# Patient Record
Sex: Male | Born: 1965 | Race: White | Hispanic: No | Marital: Married | Smoking: Former smoker
Health system: Southern US, Community
[De-identification: ages and names within clinical notes are randomized; demographics above are authoritative.]

## PROBLEM LIST (undated history)

## (undated) DIAGNOSIS — I2510 Atherosclerotic heart disease of native coronary artery without angina pectoris: Secondary | ICD-10-CM

## (undated) DIAGNOSIS — I5189 Other ill-defined heart diseases: Secondary | ICD-10-CM

## (undated) DIAGNOSIS — I10 Essential (primary) hypertension: Secondary | ICD-10-CM

## (undated) DIAGNOSIS — E785 Hyperlipidemia, unspecified: Secondary | ICD-10-CM

## (undated) HISTORY — DX: Essential (primary) hypertension: I10

## (undated) HISTORY — DX: Other ill-defined heart diseases: I51.89

## (undated) HISTORY — DX: Atherosclerotic heart disease of native coronary artery without angina pectoris: I25.10

## (undated) HISTORY — DX: Hyperlipidemia, unspecified: E78.5

---

## 2008-04-24 DIAGNOSIS — I2510 Atherosclerotic heart disease of native coronary artery without angina pectoris: Secondary | ICD-10-CM

## 2008-04-24 HISTORY — DX: Atherosclerotic heart disease of native coronary artery without angina pectoris: I25.10

## 2008-09-19 ENCOUNTER — Inpatient Hospital Stay (HOSPITAL_COMMUNITY): Admission: EM | Admit: 2008-09-19 | Discharge: 2008-09-24 | Payer: Self-pay | Admitting: Emergency Medicine

## 2008-09-19 ENCOUNTER — Emergency Department (HOSPITAL_COMMUNITY): Admission: EM | Admit: 2008-09-19 | Discharge: 2008-09-19 | Payer: Self-pay | Admitting: Family Medicine

## 2008-09-20 ENCOUNTER — Encounter (INDEPENDENT_AMBULATORY_CARE_PROVIDER_SITE_OTHER): Payer: Self-pay | Admitting: Cardiology

## 2008-09-20 HISTORY — PX: NM MYOCAR PERF WALL MOTION: HXRAD629

## 2008-09-20 HISTORY — PX: TRANSTHORACIC ECHOCARDIOGRAM: SHX275

## 2008-09-22 HISTORY — PX: CARDIAC CATHETERIZATION: SHX172

## 2008-11-24 ENCOUNTER — Encounter: Payer: Self-pay | Admitting: Internal Medicine

## 2008-12-24 ENCOUNTER — Encounter: Payer: Self-pay | Admitting: Internal Medicine

## 2009-01-25 ENCOUNTER — Encounter: Payer: Self-pay | Admitting: Internal Medicine

## 2009-03-08 ENCOUNTER — Encounter: Payer: Self-pay | Admitting: Internal Medicine

## 2009-04-05 ENCOUNTER — Encounter: Payer: Self-pay | Admitting: Internal Medicine

## 2009-04-07 ENCOUNTER — Encounter: Payer: Self-pay | Admitting: Internal Medicine

## 2009-04-21 ENCOUNTER — Ambulatory Visit: Payer: Self-pay | Admitting: Internal Medicine

## 2009-04-21 DIAGNOSIS — J310 Chronic rhinitis: Secondary | ICD-10-CM | POA: Insufficient documentation

## 2009-04-21 DIAGNOSIS — R059 Cough, unspecified: Secondary | ICD-10-CM | POA: Insufficient documentation

## 2009-04-21 DIAGNOSIS — I10 Essential (primary) hypertension: Secondary | ICD-10-CM

## 2009-04-21 DIAGNOSIS — R05 Cough: Secondary | ICD-10-CM

## 2009-06-16 ENCOUNTER — Telehealth (INDEPENDENT_AMBULATORY_CARE_PROVIDER_SITE_OTHER): Payer: Self-pay | Admitting: *Deleted

## 2009-06-24 ENCOUNTER — Ambulatory Visit: Payer: Self-pay | Admitting: Internal Medicine

## 2009-06-24 DIAGNOSIS — E785 Hyperlipidemia, unspecified: Secondary | ICD-10-CM | POA: Insufficient documentation

## 2009-07-06 ENCOUNTER — Telehealth (INDEPENDENT_AMBULATORY_CARE_PROVIDER_SITE_OTHER): Payer: Self-pay | Admitting: *Deleted

## 2009-07-07 ENCOUNTER — Ambulatory Visit: Payer: Self-pay | Admitting: Internal Medicine

## 2009-07-07 ENCOUNTER — Ambulatory Visit: Payer: Self-pay | Admitting: Radiology

## 2009-07-07 ENCOUNTER — Ambulatory Visit (HOSPITAL_BASED_OUTPATIENT_CLINIC_OR_DEPARTMENT_OTHER): Admission: RE | Admit: 2009-07-07 | Discharge: 2009-07-07 | Payer: Self-pay | Admitting: Internal Medicine

## 2009-08-10 ENCOUNTER — Ambulatory Visit: Payer: Self-pay | Admitting: Internal Medicine

## 2009-08-24 ENCOUNTER — Ambulatory Visit: Payer: Self-pay | Admitting: Internal Medicine

## 2009-08-27 LAB — CONVERTED CEMR LAB: IgE (Immunoglobulin E), Serum: 55.8 intl units/mL (ref 0.0–180.0)

## 2009-10-27 ENCOUNTER — Encounter: Payer: Self-pay | Admitting: Internal Medicine

## 2009-11-02 ENCOUNTER — Telehealth: Payer: Self-pay | Admitting: Internal Medicine

## 2009-11-09 ENCOUNTER — Ambulatory Visit: Payer: Self-pay | Admitting: Internal Medicine

## 2009-11-23 ENCOUNTER — Ambulatory Visit (HOSPITAL_COMMUNITY): Admission: RE | Admit: 2009-11-23 | Discharge: 2009-11-23 | Payer: Self-pay | Admitting: Internal Medicine

## 2010-05-24 NOTE — Assessment & Plan Note (Signed)
Summary: Pulmonary/ ext ov ? sinusitis   Copy to:  Dr. Merri Brunette Primary Provider/Referring Provider:  Dr. Merri Brunette  CC:  Acute visit.  Pt c/o dry cough x 1 wk now.  He states that after he was seen in Dec 2010 cough went away and then came back a few wks later.  He tried taking tramadol and pepcid and cough resolved then but now is back.  He states that sometimes he feels like he can not take in a deep breath...  History of Present Illness: 78 yowm quit smoking May 2010 at stent with h/o stuffy nose lifelong worse in spring and fall  April 21, 2009 cc new abruptonset cough while on ACE which was dc'd 10/2008 much better but never resolved and then  changed pattern no better with tessilon.  cough is "constant" but   day > night, completely dry assoc with sensation in midline between the shoulder blade,  not reproduced deep breathing.  no worse with ex no worse sleeping.   dx of uacs rx with pred x 6 high dose acid suppression 95% and then tried to control with diet which helped but only for a few weeks then gradually worse.    June 24, 2009  Acute visit.  Pt c/o  dental abscess 2 weeks before ov rx with abx  then much worse  dry cough x 1 wk He tried taking tramadol and pepcid and cough improved then but now is back.  He states that sometimes he feels like he can not take in a deep breath. Pt denies any significant sore throat, dysphagia, itching, sneezing,  nasal congestion or excess secretions,  fever, chills, sweats, unintended wt loss, pleuritic or exertional cp, hempoptysis, change in activity tolerance  orthopnea pnd or leg swelling. Pt also denies any obvious fluctuation in symptoms with weather or environmental change or other alleviating or aggravating factors.       Current Medications (verified): 1)  Lipitor 40 Mg Tabs (Atorvastatin Calcium) .Marland Kitchen.. 1 At Bedtime 2)  Carvedilol 3.125 Mg Tabs (Carvedilol) .Marland Kitchen.. 1 Once Daily 3)  Zetia 10 Mg Tabs (Ezetimibe) .Marland Kitchen.. 1 Once Daily 4)   Plavix 75 Mg Tabs (Clopidogrel Bisulfate) .Marland Kitchen.. 1 Once Daily 5)  Prevacid 24hr 15 Mg Cpdr (Lansoprazole) .... Take One 30-60 Min Before First and Last Meals of The Day 6)  Pepcid Ac Maximum Strength 20 Mg Tabs (Famotidine) .... One At Bedtime 7)  Tramadol Hcl 50 Mg  Tabs (Tramadol Hcl) .... One To Two By Mouth Every 4-6 Hours As Needed  Allergies (verified): No Known Drug Allergies  Past History:  Past Medical History: Hyperlipidemia Chronic cough.....................................Marland KitchenWert  Vital Signs:  Patient profile:   45 year old male Weight:      167 pounds O2 Sat:      99 % on Room air Temp:     98.5 degrees F oral Pulse rate:   110 / minute BP sitting:   140 / 84  (left arm)  Vitals Entered By: Vernie Murders (June 24, 2009 3:31 PM)  O2 Flow:  Room air  Physical Exam  Additional Exam:  amb wm nad   wt 167 June 24, 2009  HEENT: nl dentition,  and orophanx Dentition ok. Nl external ear canals without cough reflex. mod bilateral nonspecific turbinate edema NECK :  without JVD/Nodes/TM/ nl carotid upstrokes bilaterally LUNGS: no acc muscle use, clear to A and P bilaterally without cough on insp or exp maneuvers CV:  RRR  no s3 or murmur or increase in P2, no edema  ABD:  soft and nontender with nl excursion in the supine position. No bruits or organomegaly, bowel sounds nl MS:  warm without deformities, calf tenderness, cyanosis or clubbing      Impression & Recommendations:  Problem # 1:  COUGH (ICD-786.2) Upper airway cough syndrome, so named because it's frequently impossible to sort out how much is LPR vs CR/sinusitis with freq throat clearing generating secondary extra esophageal GERD from wide swings in gastric pressure that occur with throat clearing, promoting self use of mint and menthol lozenges that reduce the lower esophageal sphincter tone and exacerbate the problem further.  These symptoms are easily confused with asthma/copd by even experienced  pulmonogists because they overlap so much. These are the same pts who not infrequently have failed to tolerate ace inhibitors,  dry powder inhalers or biphosphonates or report having reflux symptoms that don't respond to standard doses of PPI  Intriguing exac with upper molar abscess suggesting sinusitis.  therefore rx x 10 days augmentin then sinus ct   Each maintenance medication was reviewed in detail including most importantly the difference between maintenance and as needed and under what circumstances the prns are to be used. See instructions for specific recommendations   Problem # 2:  HYPERTENSION, BENIGN (ICD-401.1)  His updated medication list for this problem includes:    Carvedilol 3.125 Mg Tabs (Carvedilol) .Marland Kitchen... 1 once daily   Consider change to bisoprolol, a more specific beta blocker, if cough continues  Medications Added to Medication List This Visit: 1)  Augmentin 875-125 Mg Tabs (Amoxicillin-pot clavulanate) .... By mouth twice daily 2)  Prednisone 10 Mg Tabs (Prednisone) .... 4 each am x 2days, 2x2days, 1x2days and stop 3)  Tramadol Hcl 50 Mg Tabs (Tramadol hcl) .... One to two by mouth every 4-6 hours as needed  Patient Instructions: 1)  Acid reflux is a  leading suspect here and needs to be eliminated  completely before considering additional studies or treatment options. To suppress this maximally, take Prevacid before first and last meal and pepcid 20 mg (otc) at bedtime plus diet measures as listed as long as coughing at all, and once better to your satisfaction wean the prevacid by taking just the am dose x 6 weeks then return 2)  Take delsym two tsp every 12 hours and add tramadol 50 mg up to every 4 hours to suppress the urge to cough. Swallowing water or using ice chips/non mint and menthol containing candies (such as lifesavers or sugarless jolly ranchers) are also effective.  3)  GERD (REFLUX)  is a common cause of respiratory symptoms. It commonly presents  without heartburn and can be treated with medication, but also with lifestyle changes including avoidance of late meals, excessive alcohol, smoking cessation, and avoid fatty foods, chocolate, peppermint, colas, red wine, and acidic juices such as orange juice. NO MINT OR MENTHOL PRODUCTS SO NO COUGH DROPS  4)  USE SUGARLESS CANDY INSTEAD (jolley ranchers)  5)  NO OIL BASED VITAMINS  6)  Prednisone 10 mg x 6days 7)  Augmentin 875 two times a day x 10 days 8)  If not 100% better return in 2 weeks for cxr and sinus ct Prescriptions: TRAMADOL HCL 50 MG  TABS (TRAMADOL HCL) One to two by mouth every 4-6 hours as needed  #40 x 0   Entered and Authorized by:   Nyoka Cowden MD   Signed by:   Nyoka Cowden  MD on 06/24/2009   Method used:   Electronically to        [id number] (retail)       [address]       [place], [place]  [address]       Ph: [phone] or [phone]       Fax: [phone]   RxID:   [id number] PREDNISONE 10 MG  TABS (PREDNISONE) 4 each am x 2days, 2x2days, 1x2days and stop  #14 x 0   Entered and Authorized by:   Nyoka Cowden MD   Signed by:   Nyoka Cowden MD on 06/24/2009   Method used:   Electronically to        [facility] [phone]* (retail)       [address]       [place], [place]  [address]       Ph: [phone] or [phone]       Fax: [phone]   RxID:   [id number] AUGMENTIN 875-125 MG  TABS (AMOXICILLIN-POT CLAVULANATE) By mouth twice daily  #20 x 0   Entered and Authorized by:   Nyoka Cowden MD   Signed by:   Nyoka Cowden MD on 06/24/2009   Method used:   Electronically to        [facility] [phone][facility] (retail)       [address]       [place], [place]  [address]       Ph: [phone] or [phone]       Fax: [phone]   RxID:   [id number]   Appended Document: Orders Update    Clinical Lists Changes  Orders: Added new Service order of Est. Patient Level IV ([address]3810) - Signed

## 2010-05-24 NOTE — Assessment & Plan Note (Signed)
Summary: [name]   Copy to:  Dr. Merri Larson Primary Provider/Referring Provider:  Dr. Merri Larson  CC:  Acute visit.  Pt c/o cough being no better since last seen.  He states that cough is still  non prod.  No new complaints today.Marland Kitchen  History of Present Illness: 27 yowm quit smoking May 2010 at stent with h/o stuffy nose lifelong worse in spring and fall.  April 21, 2009 cc new abruptonset cough while on ACE which was dc'd 10/2008 much better but never resolved and then  changed pattern no better with tessilon.  cough is "constant" but   day > night, completely dry assoc with vague sensation posteriorly  in midline between the shoulder blades,  not reproduced deep breathing.  no worse with ex no worse sleeping.   dx of uacs rx with pred x 6 high dose acid suppression 95% and then tried to control with diet which helped but only for a few weeks then gradually worse.    June 24, 2009 acute ov c/o  dental abscess 2 weeks before ov rx with abx  then much worse  dry cough x 1 wk He tried taking tramadol and pepcid and cough improved then but now is back.  He states that sometimes he feels like he can not take in a deep breath.  rec empiric augmentin due to ? of dental abscess and intense ppi/pepcid rx plus prednisone x 6 days.  July 07, 2009 Acute visit.  Pt c/o cough being no better since last seen, not even transiently better on prednisone.   He states that cough is still  non prod.  No change in pattern or intensity of cough, did not follow instructions re use of PPi .  Pt denies any significant sore throat, dysphagia, itching, sneezing,  nasal congestion or excess secretions,  fever, chills, sweats, unintended wt loss, pleuritic or exertional cp, hempoptysis, change in activity tolerance  orthopnea pnd or leg swelling. Pt also denies any obvious fluctuation in symptoms with weather or environmental change or other alleviating or aggravating  factors.       Current Medications (verified): 1)  Lipitor 40 Mg Tabs (Atorvastatin Calcium) .Marland Kitchen.. 1 At Bedtime 2)  Carvedilol 3.125 Mg Tabs (Carvedilol) .Marland Kitchen.. 1 Once Daily 3)  Zetia 10 Mg Tabs (Ezetimibe) .Marland Kitchen.. 1 Once Daily 4)  Plavix 75 Mg Tabs (Clopidogrel Bisulfate) .Marland Kitchen.. 1 Once Daily 5)  Prevacid 24hr 15 Mg Cpdr (Lansoprazole) .... Take One 30-60 Min Before First and Last Meals of The Day 6)  Pepcid Ac Maximum Strength 20 Mg Tabs (Famotidine) .... One At Bedtime 7)  Tramadol Hcl 50 Mg  Tabs (Tramadol Hcl) .... One To Two By Mouth Every 4-6 Hours As Needed 8)  Tramadol Hcl 50 Mg  Tabs (Tramadol Hcl) .... One To Two By Mouth Every 4-6 Hours As Needed  Allergies (verified): No Known Drug Allergies  Past History:  Past Medical History: Hyperlipidemia Chronic cough.....................................Marland KitchenWert     - Sinus CT July 07, 2009 chronic changes only   Vital Signs:  Patient profile:   45 year old male Weight:      158 pounds O2 Sat:      98 % on Room air Temp:     98.3 degrees F oral Pulse rate:   91 / minute BP sitting:   120 / 84  (left arm)  Vitals Entered By: Jacob Larson (July 07, 2009 10:59 AM)  O2  Flow:  Room air  Physical Exam  Additional Exam:  amb wm nad   wt 167 June 24, 2009 > 158 July 07, 2009  HEENT: nl dentition,  and orophanx Dentition ok. Nl external ear canals without cough reflex. mod bilateral nonspecific turbinate edema R>> L  NECK :  without JVD/Nodes/TM/ nl carotid upstrokes bilaterally LUNGS: no acc muscle use, clear to A and P bilaterally without cough on insp or exp maneuvers CV:  RRR  no s3 or murmur or increase in P2, no edema  ABD:  soft and nontender with nl excursion in the supine position. No bruits or organomegaly, bowel sounds nl MS:  warm without deformities, calf tenderness, cyanosis or clubbing      CXR  Procedure date:  07/07/2009  Findings:        Comparison: CT angio chest and portable chest x-ray 09/19/2008.     Findings: Mild emphysematous changes in the upper lobes and generalized hyperinflation, unchanged.  Lungs clear. Cardiomediastinal silhouette unremarkable.  Degenerative changes in the mid thoracic spine.   IMPRESSION: COPD/emphysema.  No acute cardiopulmonary disease.  CT of Sinus  Procedure date:  07/07/2009  Findings:      Findings: . The right maxillary sinuses small.  This may represent a so-called atelectatic sinus as result of chronic inflammatory disease.  It could also be developmental or a result of the remote trauma.  Minimal mucosal thickening of the right maxillary sinus. Left maxillary sinus unremarkable.  Opacification of the left posterior ethmoid sinus air cell and mucosal thickening of a right anterior ethmoid sinus air cell.  Sphenoid and frontal sinuses are well-aerated.   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  No definite acute sinusitis.    Impression & Recommendations:  Problem # 1:  COUGH (ICD-786.2) Upper airway cough syndrome, so named because it's frequently impossible to sort out how much is LPR vs CR/sinusitis with freq throat clearing generating secondary extra esophageal GERD from wide swings in gastric pressure that occur with throat clearing, promoting self use of mint and menthol lozenges that reduce the lower esophageal sphincter tone and exacerbate the problem further.  These symptoms are easily confused with asthma/copd by even experienced pulmonogists because they overlap so much. These are the same pts who not infrequently have failed to tolerate ace inhibitors,  dry powder inhalers or biphosphonates or report having reflux symptoms that don't respond to standard doses of PPI  Intriguing exac with upper molar abscess suggested sinusitis.  therefore rx x 10 days augmentin then sinus ct > no acute sinusitis July 07, 2009    The standardized cough guidelines recently  published in Chest are a 14 step process, not a single office visit,  and are intended  to address this problem logically,  with an alogrithm dependent on response to each progressive step  to determine a specific diagnosis with  minimal addtional testing needed. Therefore if compliance is an issue this empiric standardized approach simply won't work.  Will rechallenge with max ppi.  Of the three most common causes of chronic cough, only one (GERD) can actually cause the other two and perpetuate the cylce of cough inducing airway trauma, inflammation, heightened sensitivity to reflux which is prompted by the cough itself via a cyclical mechanism.  This may partially respond to steroids and look like asthma and post nasal drainage but never erradicated completely unless the cough and the secondary reflux are eliminated, preferably both at the  same time. See instructions for specific recommendations   Problem # 2:  HYPERTENSION, BENIGN (ICD-401.1)  The following medications were removed from the medication list:    Carvedilol 3.125 Mg Tabs (Carvedilol) .Marland Kitchen... 1 once daily His updated medication list for this problem includes:    Bystolic 5 Mg Tabs (Nebivolol hcl) ..... One tablet daily  Try the most specific beta blocker on the market in case this is a form of cough variant asthma masquerading as upper airway cough syndrome.  Orders: Est. Patient Level IV ([address]109)  Problem # 3:  CHRONIC RHINITIS (ICD-472.0)  Orders: Est. Patient Level IV (60454)   Sinus Ct reviewed:  Findings: . The right maxillary sinuses small.  This may represent a so-called atelectatic sinus as result of chronic inflammatory disease.  It could also be developmental or a result of the remote trauma.  Minimal mucosal thickening of the right maxillary sinus. Left maxillary sinus unremarkable.  Opacification of the left posterior ethmoid sinus air cell and mucosal thickening of a right anterior ethmoid sinus air cell.   Sphenoid and frontal sinuses are well-aerated.   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  No definite acute sinusitis.  Therefore no rx for now, consider trial of nasal steroids or allergy eval on next ov    Medications Added to Medication List This Visit: 1)  Bystolic 5 Mg Tabs (Nebivolol hcl) .... One tablet daily 2)  Prevacid 30 Mg Cpdr (Lansoprazole) .... Take one 30-60 min before first and last meals of the day  Other Orders: Misc. Referral (Misc. Ref) T-2 View CXR (71020TC)  Patient Instructions: 1)  Acid reflux is a  leading suspect here and needs to be eliminated  completely before considering additional studies or treatment options. To suppress this maximally, take Prevacid 30  before first and last meal and pepcid 20 mg (otc) at bedtime plus diet measures as listed as long as coughing at all, and once better to your  2)  Take delsym two tsp every 12 hours and add tramadol 50 mg up to 2 every 4 hours to suppress the urge to cough. Swallowing water or using ice chips/non mint and menthol containing candies (such as lifesavers or sugarless jolly ranchers) are also effective.  3)  GERD (REFLUX)  is a common cause of respiratory symptoms. It commonly presents without heartburn and can be treated with medication, but also with lifestyle changes including avoidance of late meals, excessive alcohol, smoking cessation, and avoid fatty foods, chocolate, peppermint, colas, red wine, and acidic juices such as orange juice. NO MINT OR MENTHOL PRODUCTS SO NO COUGH DROPS  4)  USE SUGARLESS CANDY INSTEAD (jolley ranchers)  5)  NO OIL BASED VITAMINS  6)  Bystolic 5 mg one daily in place of coreg 7)  See Patient Care Coordinator before leaving for sinus ct  Prescriptions: PREVACID 30 MG CPDR (LANSOPRAZOLE) Take one 30-60 min before first and last meals of the day  #60 x 0   Entered and Authorized by:   Nyoka Cowden MD    Signed by:   Nyoka Cowden MD on 07/07/2009   Method used:   Electronically to        [facility] [phone][facility] (retail)       [address]       [place], [place]  [address]       Ph: [phone] or [phone]       Fax: [phone]  RxID:   [id number]

## 2010-05-24 NOTE — Assessment & Plan Note (Signed)
Summary: Pulmonary/ ext f/u ov for cough > schedule MCT   Copy to:  Dr. Merri Brunette Primary Provider/Referring Provider:  Dr. Merri Brunette  CC:  6 wk followup.  Pt states that his cough is the same- no better or worse.  Marland Kitchen  History of Present Illness: 17 yowm quit smoking May 2010 at stent with h/o stuffy nose lifelong worse in spring and fall.  April 21, 2009 cc new abrupt onset cough while on ACE which was dc'd 10/2008 much better but never resolved and then  changed pattern no better with tessilon.  cough  "constant" but   day > night, completely dry assoc with vague  discomfort  posteriorly  in midline between the shoulder blades,  not reproduced deep breathing.  no worse with ex no worse sleeping.   dx of uacs rx with pred x 6 high dose acid suppression 95% and then tried to control with diet which helped but only for a few weeks then gradually worse.    June 24, 2009 acute ov c/o  dental abscess 2 weeks before ov rx with abx  then much worse  dry cough x 1 wk He tried taking tramadol and pepcid and cough improved then recurred  rec empiric augmentin due to ? of dental abscess and intense ppi/pepcid rx plus prednisone x 6 days.  July 07, 2009 Acute visit.  Pt c/o cough being no better since last seen, not even transiently better on prednisone.    non productive,   No change in pattern or intensity of cough, did not follow instructions re use of PPI. rec max acid, bystolic insead of coreg, check sinus ct > chronic changes only.  August 10, 2009 6 wk followup.  Pt states that cough is still bothering him- no better or worse rec omnaris plus one more cycle of prednisone plus chlortrimeton and continue max gerd rx.  Aug 24, 2009 2 wk followup.  Pt states that his cough had almost completely resolved until about 1 wk ago after finished prednisone cough returned but not nearly bad enough to take it any furher and did not adhere to omnaris. constant sensation of nasal congestion, even on  omnaris/afrin.  rec chlortrimeton no better.  November 09, 2009 6 wk followup.  Pt states that his cough is the same- no better or worse.  has learned to live with nasal congestion, no really taking anything for it anymore including nasal steroids. No cp/ purulent sputum or sob.   Current Medications (verified): 1)  Lipitor 40 Mg Tabs (Atorvastatin Calcium) .Marland Kitchen.. 1 At Bedtime 2)  Zetia 10 Mg Tabs (Ezetimibe) .Marland Kitchen.. 1 Once Daily 3)  Plavix 75 Mg Tabs (Clopidogrel Bisulfate) .Marland Kitchen.. 1 Once Daily 4)  Bystolic 5 Mg  Tabs (Nebivolol Hcl) .... One Tablet Daily 5)  Prevacid 30 Mg Cpdr (Lansoprazole) .Marland Kitchen.. 1 At Bedtime  Allergies (verified): No Known Drug Allergies  Past History:  Past Medical History: Hyperlipidemia Chronic cough.....................................Marland KitchenWert     - Sinus CT July 07, 2009 chronic changes only      - Trial of omnaris August 10, 2009 > no response, but only took it x 5 days with afrin     - Allergy profile Aug 24, 2009 >>> pos only for ragweed allergy     - Methacholine Challenge ordered November 09, 2009 >>>  Vital Signs:  Patient profile:   45 year old male Weight:      164.50 pounds O2 Sat:  98 % on Room air Temp:     98.1 degrees F oral Pulse rate:   81 / minute BP sitting:   112 / 70  (left arm)  Vitals Entered By: Vernie Murders (November 09, 2009 4:07 PM)  O2 Flow:  Room air  Physical Exam  Additional Exam:  amb wm nad obvious sniffling and nasal tone to voice  wt 167 June 24, 2009 > 158 July 07, 2009 > 158 August 10, 2009 > 160 Aug 24, 2009 > 164 .dte  HEENT: nl dentition,  and orophanx Dentition ok. Nl external ear canals without cough reflex. severe  bilateral nonspecific turbinate edema  bilateral  NECK :  without JVD/Nodes/TM/ nl carotid upstrokes bilaterally LUNGS: no acc muscle use, clear to A and P bilaterally without cough on insp or exp maneuvers CV:  RRR  no s3 or murmur or increase in P2, no edema  ABD:  soft and nontender with nl excursion in the  supine position. No bruits or organomegaly, bowel sounds nl MS:  warm without deformities, calf tenderness, cyanosis or clubbing      Impression & Recommendations:  Problem # 1:  COUGH (ICD-786.2) The most common causes of chronic cough in immunocompetent adults include: upper airway cough syndrome (UACS), previously referred to as postnasal drip syndrome,  caused by variety of rhinosinus conditions; (2) asthma; (3) GERD; (4) chronic bronchitis from cigarette smoking or other inhaled environmental irritants; (5) nonasthmatic eosinophilic bronchitis; and (6) bronchiectasis. These conditions, singly or in combination, have accounted for up to 94% of the causes of chronic cough in prospective studies.  The standardized cough guidelines recently published in Chest are a 14 step process, not a single office visit,  and are intended  to address this problem logically,  with an alogrithm dependent on response to each progressive step  to determine a specific diagnosis with  minimal addtional testing needed. Therefore if compliance is an issue this empiric standardized approach simply won't work.   NB the  ramp to expected improvement (and for that matter, worsening, if a chronic effective medication is stopped)  can be measured in weeks, not days, a common misconception because this is not Heartburn with no immediate cause and effect relationship so that response to therapy or lack thereof can be very difficult to assess.  for now try stop second dose of ppi and see if cough flares.  If not controlled on max gerd rx next step is methacholine challenge test > ordered to proceed in 2 weeks after max gerd rx with ppi/h2 hs in meantime  Problem # 2:  CHRONIC RHINITIS (ICD-472.0)  Allergic to ragweed but symptoms are perennial and he did improve on prednisone, nasal steroids but did not continue these once better.  Ext discussion re : I emphasized that nasal steroids have no immediate benefit in terms of  improving symptoms.  To help them reached the target tissue, the patient should use Afrin two puffs every 12 hours applied one min before using the nasal steroids.  Afrin should be stopped after no more than 5 days.  If the symptoms worsen, Afrin can be restarted after 5 days off of therapy to prevent rebound congestion from overuse of Afrin.  I also emphasized that in no way are nasal steroids a concern in terms of "addiction".    Orders: Est. Patient Level IV ([address])  Problem # 3:  HYPERTENSION, BENIGN (ICD-401.1)  His updated medication list for this problem includes:  Bystolic 5 Mg Tabs (Nebivolol hcl) ..... One tablet daily  Best option: Bystolic, the most beta -1  selective Beta blocker available in sample form, with bisoprolol the most selective generic choice  on the market. esp until we complete the Methacholine challenge test to avoid issue of false positive.  Medications Added to Medication List This Visit: 1)  Prevacid 30 Mg Cpdr (Lansoprazole) .Marland Kitchen.. 1 at bedtime 2)  Prevacid 30 Mg Cpdr (Lansoprazole) .... Take  one 30-60 min before first meal of the day 3)  Pepcid Ac Maximum Strength 20 Mg Tabs (Famotidine) .... One at bedtime  Other Orders: Misc. Referral (Misc. Ref)  Patient Instructions: 1)  See Patient Care Coordinator before leaving for methacholine challenge test which you shouldn't do until you've been on acid suppression consitently for 2 weeks

## 2010-05-24 NOTE — Letter (Signed)
Summary: [facility]   Imported By: Lester Spiro 11/09/2009 08:26:56  _____________________________________________________________________  External Attachment:    Type:   Image     Comment:   External Document

## 2010-05-24 NOTE — Progress Notes (Signed)
Summary: Refill Tramadol  Phone Note Call from Patient Call back at [phone]   Caller: Patient Call For: [name] Summary of Call: NEED PRESCRIPT FOR TRAMADOL CALLED TO [facility] PH# [phone] Initial call taken by: Rickard Patience,  June 16, 2009 2:07 PM  Follow-up for Phone Call        Spoke with pt.  He c/io dry cough and "tickle in throat" x 2 days.  He states that his cough did improve after he was seeen in Dec 2010 but then came back shortly and he states that tramadol is the only thing that helped.  He has ov with you on 06/24/09 but would lile refill on tramadol in the meantime.  Please advise if this is okay, thanks!  Follow-up by: Vernie Murders,  June 16, 2009 2:33 PM  Additional Follow-up for Phone Call Additional follow up Details #1::        ok to refill but keep f/u appt with all meds in hand  Additional Follow-up by: Nyoka Cowden MD,  June 16, 2009 4:33 PM    Additional Follow-up for Phone Call Additional follow up Details #2::    Rx called to the pharmacy with no refills.  LMOVM for pt to be made aware. Follow-up by: Vernie Murders,  June 16, 2009 4:51 PM  Prescriptions: TRAMADOL HCL 50 MG  TABS (TRAMADOL HCL) One to two by mouth every 4-6 hours as needed  #40 x 0   Entered by:   Vernie Murders   Authorized by:   Nyoka Cowden MD   Signed by:   Vernie Murders on 06/16/2009   Method used:   Historical   RxID:   [id number]

## 2010-05-24 NOTE — Assessment & Plan Note (Signed)
Summary: Pulmonary/ f/u ov with cough 90% better   Copy to:  Dr. Merri Brunette Primary Provider/Referring Provider:  Dr. Merri Brunette  CC:  2 wk followup.  Pt states that his cough had almost completely resolved until about 1 wk ago after finished prednisone cough returned.Jacob Larson  History of Present Illness: 43 yowm quit smoking May 2010 at stent with h/o stuffy nose lifelong worse in spring and fall.  April 21, 2009 cc new abruptonset cough while on ACE which was dc'd 10/2008 much better but never resolved and then  changed pattern no better with tessilon.  cough  "constant" but   day > night, completely dry assoc with vague  discomfort  posteriorly  in midline between the shoulder blades,  not reproduced deep breathing.  no worse with ex no worse sleeping.   dx of uacs rx with pred x 6 high dose acid suppression 95% and then tried to control with diet which helped but only for a few weeks then gradually worse.    June 24, 2009 acute ov c/o  dental abscess 2 weeks before ov rx with abx  then much worse  dry cough x 1 wk He tried taking tramadol and pepcid and cough improved then recurred  rec empiric augmentin due to ? of dental abscess and intense ppi/pepcid rx plus prednisone x 6 days.  July 07, 2009 Acute visit.  Pt c/o cough being no better since last seen, not even transiently better on prednisone.    non productive,   No change in pattern or intensity of cough, did not follow instructions re use of PPI. rec max acid, bystolic insead of coreg, check sinus ct > chronic changes only.  August 10, 2009 6 wk followup.  Pt states that cough is still bothering him- no better or worse rec omnaris plus one more cycle of prednisone plus chlortrimeton and continue max gerd rx.  Aug 24, 2009 2 wk followup.  Pt states that his cough had almost completely resolved until about 1 wk ago after finished prednisone cough returned but not nearly bad enough to take it any furher and did not adhere to omnaris.  constant sensation of nasal congestion, even on omnaris/afrin. Pt denies any significant sore throat, dysphagia,   fever, chills, sweats, unintended wt loss, pleuritic or exertional cp, hempoptysis, change in activity tolerance  orthopnea pnd or leg swelling.  Current Medications (verified): 1)  Lipitor 40 Mg Tabs (Atorvastatin Calcium) .Jacob Larson.. 1 At Bedtime 2)  Zetia 10 Mg Tabs (Ezetimibe) .Jacob Larson.. 1 Once Daily 3)  Plavix 75 Mg Tabs (Clopidogrel Bisulfate) .Jacob Larson.. 1 Once Daily 4)  Pepcid Ac Maximum Strength 20 Mg Tabs (Famotidine) .... One At Bedtime 5)  Bystolic 5 Mg  Tabs (Nebivolol Hcl) .... One Tablet Daily 6)  Prevacid 30 Mg Cpdr (Lansoprazole) .... Take One 30-60 Min Before First and Last Meals of The Day  Allergies (verified): No Known Drug Allergies  Past History:  Past Medical History: Hyperlipidemia Chronic cough.....................................Jacob KitchenWert     - Sinus CT July 07, 2009 chronic changes only      - Trial of omnaris August 10, 2009 > no response, but only took it x 5 days with afrin     - Allergy profile Aug 24, 2009 >>>  Vital Signs:  Patient profile:   45 year old male Weight:      160 pounds O2 Sat:      98 % on Room air Temp:     97.9  degrees F oral Pulse rate:   76 / minute BP sitting:   118 / 80  (left arm)  Vitals Entered By: Vernie Murders (Aug 24, 2009 12:02 PM)  O2 Flow:  Room air  Physical Exam  Additional Exam:  amb wm nad obvious sniffling and nasal tone to voice  wt 167 June 24, 2009 > 158 July 07, 2009 > 158 August 10, 2009 > 160 Aug 24, 2009  HEENT: nl dentition,  and orophanx Dentition ok. Nl external ear canals without cough reflex. mod bilateral nonspecific turbinate edema R>> L  NECK :  without JVD/Nodes/TM/ nl carotid upstrokes bilaterally LUNGS: no acc muscle use, clear to A and P bilaterally without cough on insp or exp maneuvers CV:  RRR  no s3 or murmur or increase in P2, no edema  ABD:  soft and nontender with nl excursion in the supine  position. No bruits or organomegaly, bowel sounds nl MS:  warm without deformities, calf tenderness, cyanosis or clubbing      Impression & Recommendations:  Problem # 1:  CHRONIC RHINITIS (ICD-472.0)  Orders: T-Allergy Profile [place], [place], [place], [place], [place] ([id number]) Est. Patient Level IV ([address])  Noncompliant with omnaris, has not tried singulair yet but not inclined to rx more aggressively at present  Problem # 2:  COUGH (ICD-786.2)  The most common causes of chronic cough in immunocompetent adults include: upper airway cough syndrome (UACS), previously referred to as postnasal drip syndrome,  caused by variety of rhinosinus conditions; (2) asthma; (3) GERD; (4) chronic bronchitis from cigarette smoking or other inhaled environmental irritants; (5) nonasthmatic eosinophilic bronchitis; and (6) bronchiectasis. These conditions, singly or in combination, have accounted for up to 94% of the causes of chronic cough in prospective studies.  The standardized cough guidelines recently published in Chest are a 14 step process, not a single office visit,  and are intended  to address this problem logically,  with an alogrithm dependent on response to each progressive step  to determine a specific diagnosis with  minimal addtional testing needed. Therefore if compliance is an issue this empiric standardized approach simply won't work.   NB the  ramp to expected improvement (and for that matter, worsening, if a chronic effective medication is stopped)  can be measured in weeks, not days, a common misconception because this is not Heartburn with no immediate cause and effect relationship so that response to therapy or lack thereof can be very difficult to assess.  for now try stop second dose of ppi and see if cough flares.  If not controlled on max gerd rx next step is methacholine challenge test  Orders: Est. Patient Level IV ([address])  Problem # 3:  HYPERTENSION, BENIGN (ICD-401.1)  His updated  medication list for this problem includes:    Bystolic 5 Mg Tabs (Nebivolol hcl) ..... One tablet daily   Prefer avoid non-selective B1 for now in favor of Bystolic, the most beta -1  selective Beta blocker available in sample form, with bisoprolol the most selective generic choice  on the market.   Medications Added to Medication List This Visit: 1)  Prevacid 30 Mg Cpdr (Lansoprazole) .... Take  one 30-60 min before first meal of the day  Patient Instructions: 1)  Please schedule a follow-up appointment in 6 weeks, sooner if needed  2)  Try off the pm dose of prevacid if tolerated 3)  if drainage, itching/sneezing try chlortrimeton  4)  Call if condition worsens 5)  Next step is methacholine challenge  Prescriptions: BYSTOLIC 5 MG  TABS (NEBIVOLOL HCL) One tablet daily  #30 x 11   Entered and Authorized by:   Nyoka Cowden MD   Signed by:   Nyoka Cowden MD on 08/24/2009   Method used:   Electronically to        [facility]* (retail)       [address] [place], [place]  [address]       Ph: [phone]       Fax: [phone]   RxID:   [id number]

## 2010-05-24 NOTE — Letter (Signed)
Summary: [facility]   Imported By: Lester [place] 11/09/2009 08:36:10  _____________________________________________________________________  External Attachment:    Type:   Image     Comment:   External Document

## 2010-05-24 NOTE — Assessment & Plan Note (Signed)
Summary: Pulmonary/ ext f/u cough/ rx omnaris   Copy to:  Dr. Merri Brunette Primary Provider/Referring Provider:  Dr. Merri Brunette  CC:  6 wk followup.  Pt states that cough is still bothering him- no better or worse.  Cough is non prod.  He states that he sometimes forgets to take his pm dose of the prevacid.  He has had to d/c the tramadol because this makes him "feel spaced out"..  History of Present Illness: 5 yowm quit smoking May 2010 at stent with h/o stuffy nose lifelong worse in spring and fall.  April 21, 2009 cc new abruptonset cough while on ACE which was dc'd 10/2008 much better but never resolved and then  changed pattern no better with tessilon.  cough is "constant" but   day > night, completely dry assoc with vague sensation posteriorly  in midline between the shoulder blades,  not reproduced deep breathing.  no worse with ex no worse sleeping.   dx of uacs rx with pred x 6 high dose acid suppression 95% and then tried to control with diet which helped but only for a few weeks then gradually worse.    June 24, 2009 acute ov c/o  dental abscess 2 weeks before ov rx with abx  then much worse  dry cough x 1 wk He tried taking tramadol and pepcid and cough improved then but now is back.  He states that sometimes he feels like he can not take in a deep breath.  rec empiric augmentin due to ? of dental abscess and intense ppi/pepcid rx plus prednisone x 6 days.  July 07, 2009 Acute visit.  Pt c/o cough being no better since last seen, not even transiently better on prednisone.   He states that cough is still  non prod.  No change in pattern or intensity of cough, did not follow instructions re use of PPI. rec max acid, bystolic insead of coreg, check sinus ct > chronic changes only.  August 10, 2009 6 wk followup.  Pt states that cough is still bothering him- no better or worse.  Cough is non prod.  He states that he sometimes forgets to take his pm dose of the prevacid.  He has had to  d/c the tramadol because this makes him "feel spaced out".  Notes increase nasal congestion but no definite tkckle like he got while on ACE.  Pt denies any significant sore throat, dysphagia, itching, sneezing,   fever, chills, sweats, unintended wt loss, pleuritic or exertional cp, hempoptysis, change in activity .tolerance  orthopnea pnd or leg swelling, denies  obvious fluctuation in symptoms with weather/ environmental change or other alleviating aggravating factors  Current Medications (verified): 1)  Lipitor 40 Mg Tabs (Atorvastatin Calcium) .Marland Kitchen.. 1 At Bedtime 2)  Zetia 10 Mg Tabs (Ezetimibe) .Marland Kitchen.. 1 Once Daily 3)  Plavix 75 Mg Tabs (Clopidogrel Bisulfate) .Marland Kitchen.. 1 Once Daily 4)  Pepcid Ac Maximum Strength 20 Mg Tabs (Famotidine) .... One At Bedtime 5)  Bystolic 5 Mg  Tabs (Nebivolol Hcl) .... One Tablet Daily 6)  Prevacid 30 Mg Cpdr (Lansoprazole) .... Take One 30-60 Min Before First and Last Meals of The Day  Allergies (verified): No Known Drug Allergies  Past History:  Past Medical History: Hyperlipidemia Chronic cough.....................................Marland KitchenWert     - Sinus CT July 07, 2009 chronic changes only      - Trial of omnaris August 10, 2009   Vital Signs:  Patient profile:   45 year old  male Weight:      158 pounds O2 Sat:      97 % on Room air Temp:     98.8 degrees F oral Pulse rate:   93 / minute BP sitting:   122 / 80  (left arm)  Vitals Entered By: Vernie Murders (August 10, 2009 11:44 AM)  O2 Flow:  Room air  Physical Exam  Additional Exam:  amb wm nad obvious sniffling and nasal tone to voice  wt 167 June 24, 2009 > 158 July 07, 2009 > 158 August 10, 2009  HEENT: nl dentition,  and orophanx Dentition ok. Nl external ear canals without cough reflex. mod bilateral nonspecific turbinate edema R>> L  NECK :  without JVD/Nodes/TM/ nl carotid upstrokes bilaterally LUNGS: no acc muscle use, clear to A and P bilaterally without cough on insp or exp maneuvers CV:   RRR  no s3 or murmur or increase in P2, no edema  ABD:  soft and nontender with nl excursion in the supine position. No bruits or organomegaly, bowel sounds nl MS:  warm without deformities, calf tenderness, cyanosis or clubbing      Impression & Recommendations:  Problem # 1:  CHRONIC RHINITIS (ICD-472.0)  Severe on exam and probably a factor in his chronic cough.   Each maintenance medication was reviewed in detail including most importantly the difference between maintenance and as needed and under what circumstances the prns are to be used. See instructions for specific recommendations   Orders: Est. Patient Level IV (40981)  Problem # 2:  COUGH (ICD-786.2)  The most common causes of chronic cough in immunocompetent adults include: upper airway cough syndrome (UACS), previously referred to as postnasal drip syndrome,  caused by variety of rhinosinus conditions; (2) asthma; (3) GERD; (4) chronic bronchitis from cigarette smoking or other inhaled environmental irritants; (5) nonasthmatic eosinophilic bronchitis; and (6) bronchiectasis. These conditions, singly or in combination, have accounted for up to 94% of the causes of chronic cough in prospective studies.   still feel this is most c/w  Classic Upper airway cough syndrome, so named because it's frequently impossible to sort out how much is  CR/sinusitis with freq throat clearing (which can be related to primary GERD)   vs  causing  secondary extra esophageal GERD from wide swings in gastric pressure that occur with throat clearing, promoting self use of mint and menthol lozenges that reduce the lower esophageal sphincter tone and exacerbate the problem further These are the same pts who not infrequently have failed to tolerate ace inhibitors,  dry powder inhalers or biphosphonates or report having reflux symptoms that don't respond to standard doses of PPI   Orders: Est. Patient Level IV (19147)  Problem # 3:  HYPERTENSION, BENIGN  (ICD-401.1)  ok on rx   His updated medication list for this problem includes:    Bystolic 5 Mg Tabs (Nebivolol hcl) ..... One tablet daily  avoid  nonspecific Beta blockers until we sort this out.  Orders: Est. Patient Level IV (82956)  Medications Added to Medication List This Visit: 1)  Prednisone 10 Mg Tabs (Prednisone) .... 4 each am x 2 days,  3 x 2days, 2x2 days, and 1x2 days 2)  Omnaris 50 Mcg/act Susp (Ciclesonide) .... One twice daily  Complete Medication List: 1)  Lipitor 40 Mg Tabs (Atorvastatin calcium) .Marland Kitchen.. 1 at bedtime 2)  Zetia 10 Mg Tabs (Ezetimibe) .Marland Kitchen.. 1 once daily 3)  Plavix 75 Mg Tabs (Clopidogrel bisulfate) .Marland Kitchen.. 1 once  daily 4)  Pepcid Ac Maximum Strength 20 Mg Tabs (Famotidine) .... One at bedtime 5)  Bystolic 5 Mg Tabs (Nebivolol hcl) .... One tablet daily 6)  Prevacid 30 Mg Cpdr (Lansoprazole) .... Take one 30-60 min before first and last meals of the day 7)  Prednisone 10 Mg Tabs (Prednisone) .... 4 each am x 2 days,  3 x 2days, 2x2 days, and 1x2 days 8)  Omnaris 50 Mcg/act Susp (Ciclesonide) .... One twice daily  Patient Instructions: 1)  Acid reflux is a  leading suspect here and needs to be eliminated  completely before considering additional studies or treatment options. To suppress this maximally, take Prevacid 30  before first and last meal and pepcid 20 mg (otc) at bedtime plus diet measures as listed as long as coughing at all, and once better to your  2)  Take delsym two tsp every 12 hours and add tramadol 50 mg up to 2 every 4 hours to suppress the urge to cough. Swallowing water or using ice chips/non mint and menthol containing candies (such as lifesavers or sugarless jolly ranchers) are also effective.  3)  GERD (REFLUX)  is a common cause of respiratory symptoms. It commonly presents without heartburn and can be treated with medication, but also with lifestyle changes including avoidance of late meals, excessive alcohol, smoking cessation, and  avoid fatty foods, chocolate, peppermint, colas, red wine, and acidic juices such as orange juice. NO MINT OR MENTHOL PRODUCTS SO NO COUGH DROPS  4)  USE SUGARLESS CANDY INSTEAD (jolley ranchers)  5)  NO OIL BASED VITAMINS  6)  Bystolic 5 mg one daily in place of coreg 7)  Chlortrimeton 4 mg one every 6 hours as needed for itching sneezing  8)  Stay on prevacid Take one 30-60 min before first and last meals of the day and pepcid at bedtime 9)  Omnaris one twice daily 10)  I emphasized that nasal steroids have no immediate benefit in terms of improving symptoms.  To help them reached the target tissue, the patient should use Afrin two puffs every 12 hours applied one min before using the nasal steroids.  Afrin should be stopped after no more than 5 days.  If the symptoms worsen, Afrin can be restarted after 5 days off of therapy to prevent rebound congestion from overuse of Afrin.  I also emphasized that in no way are nasal steroids a concern in terms of "addiction". 11)   Please schedule a follow-up appointment in 2 weeks, sooner if needed  Prescriptions: PREDNISONE 10 MG  TABS (PREDNISONE) 4 each am x 2 days,  3 x 2days, 2x2 days, and 1x2 days  #20 x 0   Entered and Authorized by:   Nyoka Cowden MD   Signed by:   Nyoka Cowden MD on 08/10/2009   Method used:   Electronically to        [facility] [phone]* (retail)       [address]       [place], [place]  [address]       Ph: [phone] or [phone]       Fax: [phone]   RxID:   [id number]

## 2010-05-24 NOTE — Progress Notes (Signed)
Summary: nos appt  Phone Note Call from Patient   Caller: [email or web address]  Call For: Jacob Larson Summary of Call: Rsc nos from 7/11 to 7/19 @ 4p. Initial call taken by: Darletta Moll,  November 02, 2009 9:37 AM

## 2010-05-24 NOTE — Progress Notes (Signed)
Summary: ? sinus ct or ov  Phone Note Call from Patient Call back at Work Phone [phone]   Caller: Patient Call For: wert Reason for Call: Talk to Nurse Summary of Call: returned call leslie Initial call taken by: Eugene Gavia,  July 06, 2009 8:34 AM  Follow-up for Phone Call        Dr. Sherene Sires, pt had requested tramadol and you advised that okay x 1 but needs to sched sinus ct.  Before I could get in touch with him, we went ahead and sched appt with you for tommorrow.  Do you want pt to keep appt or just go ahead and sched sinus ct for now? Please advise thanks! ok for ov Follow-up by: Vernie Murders,  July 06, 2009 8:55 AM  Additional Follow-up for Phone Call Additional follow up Details #1::        Tramadol has been refilled.  LMOM for pt to be aware of this and that he should keep appt for tommorrow.   Additional Follow-up by: Vernie Murders,  July 06, 2009 2:41 PM

## 2010-08-01 LAB — BASIC METABOLIC PANEL
BUN: 15 mg/dL (ref 6–23)
CO2: 26 mEq/L (ref 19–32)
CO2: 29 mEq/L (ref 19–32)
CO2: 31 mEq/L (ref 19–32)
Calcium: 9.1 mg/dL (ref 8.4–10.5)
Calcium: 9.5 mg/dL (ref 8.4–10.5)
Chloride: 106 mEq/L (ref 96–112)
Chloride: 107 mEq/L (ref 96–112)
Chloride: 107 mEq/L (ref 96–112)
Creatinine, Ser: 0.86 mg/dL (ref 0.4–1.5)
GFR calc Af Amer: >60 mL/min (ref >60)
GFR calc Af Amer: >60 mL/min (ref >60)
Glucose, Bld: 121 mg/dL — ABNORMAL HIGH (ref 70–99)
Glucose, Bld: 123 mg/dL — ABNORMAL HIGH (ref 70–99)
Glucose, Bld: 123 mg/dL — ABNORMAL HIGH (ref 70–99)
Potassium: 3.4 mEq/L — ABNORMAL LOW (ref 3.5–5.1)
Potassium: 3.7 mEq/L (ref 3.5–5.1)
Sodium: 143 mEq/L (ref 135–145)
Sodium: 145 mEq/L (ref 135–145)

## 2010-08-01 LAB — CBC
HCT: 45.5 % (ref 39.0–52.0)
Hemoglobin: 14.9 g/dL (ref 13.0–17.0)
Hemoglobin: 16 g/dL (ref 13.0–17.0)
MCHC: 35.1 g/dL (ref 30.0–36.0)
MCHC: 35.2 g/dL (ref 30.0–36.0)
MCV: 93.4 fL (ref 78.0–100.0)
MCV: 94.1 fL (ref 78.0–100.0)
Platelets: 198 10*3/uL (ref 150–400)
RBC: 4.52 MIL/uL (ref 4.22–5.81)
RBC: 4.53 MIL/uL (ref 4.22–5.81)
RDW: 13.3 % (ref 11.5–15.5)
RDW: 13.4 % (ref 11.5–15.5)
WBC: 13.1 10*3/uL — ABNORMAL HIGH (ref 4.0–10.5)

## 2010-08-01 LAB — CARDIAC PANEL(CRET KIN+CKTOT+MB+TROPI)
CK, MB: 3.3 ng/mL (ref 0.3–4.0)
CK, MB: 5.6 ng/mL — ABNORMAL HIGH (ref 0.3–4.0)
Relative Index: INVALID (ref 0.0–2.5)
Relative Index: INVALID (ref 0.0–2.5)
Relative Index: INVALID (ref 0.0–2.5)
Total CK: 72 U/L (ref 7–232)
Total CK: 87 U/L (ref 7–232)
Troponin I: 0.4 ng/mL — ABNORMAL HIGH (ref 0.00–0.06)
Troponin I: 0.43 ng/mL — ABNORMAL HIGH (ref 0.00–0.06)

## 2010-08-02 LAB — CK TOTAL AND CKMB (NOT AT ARMC)
CK, MB: 1.6 ng/mL (ref 0.3–4.0)
Relative Index: INVALID (ref 0.0–2.5)

## 2010-08-02 LAB — COMPREHENSIVE METABOLIC PANEL
AST: 22 U/L (ref 0–37)
Albumin: 4 g/dL (ref 3.5–5.2)
BUN: 7 mg/dL (ref 6–23)
Creatinine, Ser: 0.69 mg/dL (ref 0.4–1.5)
GFR calc Af Amer: >60 mL/min (ref >60)
Total Protein: 7.1 g/dL (ref 6.0–8.3)

## 2010-08-02 LAB — DIFFERENTIAL
Basophils Absolute: 0 10*3/uL (ref 0.0–0.1)
Eosinophils Relative: 0 % (ref 0–5)
Lymphocytes Relative: 10 % — ABNORMAL LOW (ref 12–46)
Lymphs Abs: 1.4 10*3/uL (ref 0.7–4.0)
Lymphs Abs: 2.8 10*3/uL (ref 0.7–4.0)
Monocytes Absolute: 0.7 10*3/uL (ref 0.1–1.0)
Monocytes Relative: 5 % (ref 3–12)
Monocytes Relative: 9 % (ref 3–12)
Neutro Abs: 11.6 10*3/uL — ABNORMAL HIGH (ref 1.7–7.7)
Neutro Abs: 6.7 10*3/uL (ref 1.7–7.7)
Neutrophils Relative %: 63 % (ref 43–77)

## 2010-08-02 LAB — POCT I-STAT, CHEM 8
BUN: 8 mg/dL (ref 6–23)
Calcium, Ion: 1.14 mmol/L (ref 1.12–1.32)
Chloride: 107 mEq/L (ref 96–112)
Creatinine, Ser: 0.7 mg/dL (ref 0.4–1.5)
Glucose, Bld: 100 mg/dL — ABNORMAL HIGH (ref 70–99)
HCT: 47 % (ref 39.0–52.0)
Hemoglobin: 16 g/dL (ref 13.0–17.0)
Potassium: 4.1 mEq/L (ref 3.5–5.1)
Sodium: 138 meq/L (ref 135–145)
TCO2: 23 mmol/L (ref 0–100)

## 2010-08-02 LAB — URINALYSIS, ROUTINE W REFLEX MICROSCOPIC
Glucose, UA: NEGATIVE mg/dL
Hgb urine dipstick: NEGATIVE
Ketones, ur: 15 mg/dL — AB
Protein, ur: NEGATIVE mg/dL
Urobilinogen, UA: 0.2 mg/dL (ref 0.0–1.0)

## 2010-08-02 LAB — CBC
HCT: 44.2 % (ref 39.0–52.0)
MCV: 92.8 fL (ref 78.0–100.0)
MCV: 93 fL (ref 78.0–100.0)
Platelets: 208 10*3/uL (ref 150–400)
RBC: 4.82 MIL/uL (ref 4.22–5.81)
RDW: 13.4 % (ref 11.5–15.5)
WBC: 10.6 10*3/uL — ABNORMAL HIGH (ref 4.0–10.5)
WBC: 13.9 10*3/uL — ABNORMAL HIGH (ref 4.0–10.5)

## 2010-08-02 LAB — CARDIAC PANEL(CRET KIN+CKTOT+MB+TROPI)
Relative Index: 1.5 (ref 0.0–2.5)
Relative Index: INVALID (ref 0.0–2.5)
Relative Index: INVALID (ref 0.0–2.5)
Total CK: 110 U/L (ref 7–232)
Total CK: 90 U/L (ref 7–232)
Troponin I: 0.07 ng/mL — ABNORMAL HIGH (ref 0.00–0.06)
Troponin I: 0.11 ng/mL — ABNORMAL HIGH (ref 0.00–0.06)

## 2010-08-02 LAB — PROTIME-INR: INR: 0.9 (ref 0.00–1.49)

## 2010-08-02 LAB — APTT: aPTT: 31 seconds (ref 24–37)

## 2010-08-02 LAB — POCT CARDIAC MARKERS
CKMB, poc: <1 ng/mL — ABNORMAL LOW (ref 1.0–8.0)
Myoglobin, poc: 74.8 ng/mL (ref 12–200)
Troponin i, poc: <0.05 ng/mL (ref 0.00–0.09)

## 2010-08-02 LAB — RAPID URINE DRUG SCREEN, HOSP PERFORMED
Cocaine: NOT DETECTED
Tetrahydrocannabinol: NOT DETECTED

## 2010-08-02 LAB — LIPID PANEL
HDL: 48 mg/dL (ref >39)
Total CHOL/HDL Ratio: 5.8 RATIO

## 2010-09-06 NOTE — Cardiovascular Report (Signed)
NAME:  [name], [name] NO.:  [id number]   MEDICAL RECORD NO.:  [id number]          PATIENT TYPE:  INP   LOCATION:  2918                         FACILITY:  [facility]   PHYSICIAN:  Cristy Hilts. Jacinto Halim, MD       DATE OF BIRTH:  17-Dec-1965   DATE OF PROCEDURE:  09/22/2008  DATE OF DISCHARGE:                            CARDIAC CATHETERIZATION   PROCEDURES PERFORMED:  1. Percutaneous transluminal coronary angioplasty and stenting of the      distal RCA and mid RCA with implantation of 2 overlapping stents.  2. Left ventriculography.   INDICATIONS:  Jacob Larson is a 45 year old gentleman who was admitted to  hospital with non-ST-elevation myocardial infarction.  He has history of  smoking, hyperlipidemia, and hypertension as risk factors.  He underwent  diagnostic cardiac catheterization by Dr. Sheliah Mends.  He was found  to have high-grade stenosis of the distal right coronary artery.  The  mid right coronary artery stenosis appeared to be moderate at most;  however because of continued chest discomfort, we felt that this was  probably significant and also appeared to be 80%, hence we decided to  stent this although it was very complex anatomy.   Please see Dr. Gretta Began dictation for complete diagnostic cardiac  catheterization data.   Briefly right coronary artery.  Right coronary artery is a large-caliber  vessel and a dominant vessel giving origin to a high PDA and continues  as a distal RCA and gives origin to secondary PDA and a moderate-sized  PLA branch.  At the bifurcation of the primary PDA and distal right  coronary artery, there is a complex 80% stenosis.  Mid segment of the  distal right coronary artery showed a focal 95-99% hazy stenosis.   INTERVENTION DATA:  Successful PTCA and stenting of the distal right  coronary artery with implantation of a 2.5 x 24-mm endeavor drug-eluting  stent.  Poststent deployment angiography revealed excellent results.  The  guidewires were withdrawn and angiographied.  Excellent results were  again confirmed.  The ostium of the primary PDA and distal right  coronary artery stenosis appeared to be stable.   However, because of continued chest discomfort, repeat angiography was  performed and we decided to proceed with stenting in the mid RCA because  of haziness.   Successful PTCA and stenting of the mid RCA and distal RCA overlapping  the previously placed stent with implantation of a 3.0 x 24-mm endeavor  drug-eluting stent.  The entire lesion and also the distal right  coronary artery stent was dilated with a 3.0 x 21-mm sprinter Terrell balloon  at a peak atmosphere pressure of 14 atmospheric pressures.   Excellent results were noted with overall stenosis reduction of 99% in  the distal RCA to 0% and 80% to 0% in the mid RCA with TIMI III to TIMI  III flow maintained at end of the procedure.   The patient remained almost asymptomatic at the end of the procedure  feeling significantly improved after a second stent was placed.   RECOMMENDATIONS:  The patient will be  continued on aspirin for  indefinitely and Plavix for at least a period of a year.  He will be  discharged home in 1-2 days depending on his symptomatology.  His CPKs  will be followed through.   TECHNIQUE OF PROCEDURE:  Please see diagnostic cardiac catheterization  by Dr. Garen Lah.  Intervention was performed using Angiomax for  anticoagulation.   Using [employer] as a guidewire, I was able with difficulty wire the  distal right coronary artery after the origin of the primary moderate-  sized PDA branch.  Because of tortuous and complex anatomy, I had  difficulty in wiring this; however, once I was able to pass the wire, I  predilated the distal right coronary artery stenosis with a 2.0 x 20-mm  sprinter balloon at around 10 atmospheric pressure.  This was followed  by stenting with a 2.5 x 24-mm endeavor stent at 10 atmospheric   pressure.  The stent was postdilated to 2.5 x 21-mm endeavor or [place]  balloon at 14 atmospheric pressure.  Having performed this, angiography  revealed excellent results.  The guidewire was withdrawn and  angiographied.  Guide catheter disengaged and pulled out of the body.   Left ventriculography was performed in both LAO and RAO projection using  a pigtail catheter.  After the procedure, the patient had been  complaining of severe chest discomfort without any EKG changes.  Hence  we decided to take a re-look at the right coronary artery and a 6-French  FR-4 diagnostic catheter was utilized again right coronary angiography  was performed.  This confirmed that the stenosis at the primary PDA  branch and distal RCA and mid RCA was tight with 80-85% stenosis and  haziness.  Hence we decided to proceed with stenting.  Two wires were  utilized initially placing an Clinical cytogeneticist into the primary PDA branch  and a Kruger wire was utilized to place into the distal right coronary  artery.  Then I used a 2.0 x 20-mm sprinter balloon and a balloon  angioplasty at 10 atmospheric pressure was performed at the ostium of  the primary PDA and distal RCA stenosis.  Having performed this, I  decided to stent this with a 3.0 x 24 mm endeavor stent, which was  initially deployed at around 10 atmospheric pressure.  Tight waist was  noted at the PDA and distal RCA stenosis, hence I decided to proceed  with a postdilatation with a 3.0 x 21-mm sprinter [place] balloon.  Throughout the entire lesion length including the distal stent was  postdilated with the same 3-mm balloon at a peak of 14 atmospheric  pressure.  Having performed this, angiography revealed excellent  results.  During the procedure, intracoronary nitroglycerin was also  administered.   PROCEDURAL NARRATION:  The guidewires were withdrawn and angiography  repeated.  Guide catheter disengaged and pulled out of the body.  The  patient tolerated the  procedure well.  There was no immediate  complications noted.      Cristy Hilts. Jacinto Halim, MD  Electronically Signed     JRG/MEDQ  D:  09/22/2008  T:  09/23/2008  Job:  [id number]   cc:   Soyla Murphy. Renne Crigler, M.D.

## 2010-09-06 NOTE — Cardiovascular Report (Signed)
NAME:  Jacob Larson, Jacob Larson NO.:  [id number]   MEDICAL RECORD NO.:  [id number]          PATIENT TYPE:  INP   LOCATION:  2918                         FACILITY:  [facility]   PHYSICIAN:  Sheliah Mends, MD      DATE OF BIRTH:  29-Jul-1965   DATE OF PROCEDURE:  09/22/2008  DATE OF DISCHARGE:                            CARDIAC CATHETERIZATION   INDICATION:  [name] is a 45 year old gentleman with a strong family  history of coronary artery disease, history of tobacco use and untreated  dyslipidemia who presented with complaints of chest pain to the  emergency department.  His workup showed evidence of a non-ST elevation  myocardial infarction with mildly elevated troponins.  His transthoracic  echocardiogram showed preserved left ventricular function.  Given Mr.  Blatz risk factors a decision was made to proceed with cardiac  catheterization.   PROCEDURE:  After informed consent was obtained, [name] was brought  to the Second Floor Cardiac Catheterization Lab at [facility]  in a postabsorptive state.  He was draped in sterile fashion.  Subsequently, he received conscious sedation and local anesthesia to the  right groin using 1% lidocaine.  A 6-French arterial sheath was inserted  into the right femoral artery using the modified Seldinger technique.   A 6-French right and left Judkins #4 catheter was used to perform  selective coronary angiography.   FINDINGS:  1. Left main coronary artery.  Left main coronary artery is a medium      caliber, short vessel that bifurcates in the LAD and left      circumflex artery.   There is no angiographically significant coronary artery disease.   1. The LAD.  The LAD is a medium caliber long vessel that wraps around      the apex.  The distal vessel caliber is small.  The LAD gives one      large first diagonal branch and a second smaller diagonal branch      off.  There is no angiographically significant disease in the  LAD      territory.   1. Left circumflex artery.  The left circumflex artery is a large      vessel that gives off a large obtuse marginal branch and a smaller      obtuse marginal II branch.  There is no angiographically      significant disease seen.   1. Right coronary artery.  The right coronary artery is a large,      dominant vessel.  It gives off 2 PDA branches.  There is 20% lesion      in the distal RCA followed by 90% lesion.   FINDINGS:  Single-vessel coronary artery disease with a hemodynamically  significant lesion in the distal RCA.   PLAN:  [name] will proceed to percutaneous coronary intervention and  stent placement to the distal RCA.  Dr. Jacinto Halim will perform the  procedure.  In addition, [name] will undergo aggressive risk factor  modification including tobacco and smoking cessation as well as medical  therapy for dyslipidemia and  hypertension.   The diagnostic part of the procedure was completed without complications  and a left ventriculogram was deferred and may be performed at Dr.  Verl Dicker discretion.      Sheliah Mends, MD  Electronically Signed     JE/MEDQ  D:  09/22/2008  T:  09/23/2008  Job:  [id number]

## 2010-09-06 NOTE — H&P (Signed)
NAME:  Jacob Larson, Jacob Larson NO.:  [id number]   MEDICAL RECORD NO.:  [id number]          PATIENT TYPE:  INP   LOCATION:  2027                         FACILITY:  [facility]   PHYSICIAN:  Sheliah Mends, MD      DATE OF BIRTH:  1966/02/18   DATE OF ADMISSION:  09/19/2008  DATE OF DISCHARGE:                              HISTORY & PHYSICAL   CHIEF COMPLAINT:  Substernal chest pain.   HISTORY OF PRESENT ILLNESS:  Jacob Larson is a 45 year old male who works  in [employer].  He smokes a pack a day.  He has previously been diagnosed  with dyslipidemia but does not take his Lipitor anymore.  He is followed  by Jacob Larson. He was in his usual state of health the last night when he  awakened with some discomfort in his mid chest which he described as  indigestion.  He did have some radiation to his back between the  shoulders.  He had no associated nausea or vomiting but did become  mildly diaphoretic.  He took aspirin and antacids and after about 30  minutes his symptoms abated.  He went back to bed and woke up the next  morning feeling fine.  Thirty minutes after awakening he started to give  recurrent discomfort.  Symptoms would come and go and last about 10  minutes.  He finally went to urgent care at [facility] about 11:00 a.m. this  morning.  He is admitted now for further evaluation.  Since being  started on IV nitroglycerin, he has not had recurrent pain.   PAST MEDICAL HISTORY:  1. History of dyslipidemia.  2. He had an appendectomy in the past.   There is no history of diabetes or hypertension.   MEDICATIONS:  He takes no medications.   ALLERGIES:  He has no known drug allergies.   SOCIAL HISTORY:  He is a pack a day smoker.  He works in Air[employer].  He is  married.  He has one daughter.   FAMILY HISTORY:  Is remarkable in that his father died at 49 of an MI,  the patient thinks he had his first heart attack when he was about 52.  He has one younger brother without significant coronary  disease history.   REVIEW OF SYSTEMS:  He has had remote palpitations but no workup for  this.  Has not had recent problems with this.  He had been on a PPI and  the past.  He never had GI bleeding or melena.  He denies any kidney  stones.  Has not had syncope.   PHYSICAL EXAMINATION:  VITAL SIGNS:  Blood pressure 138/88, pulse 100,  temperature 98.1.  GENERAL:  He is a well-developed, well-nourished male in no acute  distress.  HEENT: Normocephalic. Extraocular movements are intact. Sclera is  nonicteric. Conjunctivae within normal limits.  NECK:  Without JVD or bruit.  CHEST:  Clear to auscultation.  CARDIAC:  Reveals regular rate and rhythm without murmur or gallop.  Normal S1 and S2.  ABDOMEN:  Nontender.  No hepatosplenomegaly.  EXTREMITIES:  Without edema.  Distal pulses are 3+/4.  There are no  femoral bruits.  NEURO:  Exam grossly intact.  He is awake, alert and oriented,  operative.  Moves all extremities without obvious deficit.  SKIN:  Is cool and dry.   LABORATORY DATA:  His EKG shows sinus rhythm without acute changes.   White count is 13.9, hemoglobin 15.6, hematocrit 44.2, platelets to  208,000. Sodium 138, potassium 4.1, BUN 8, creatinine 0.7.  LFTs were  normal.  Troponin's were negative x2.   CT scan is negative for pulmonary embolism.   IMPRESSION:  1. Unstable angina.  2. Strong family history of coronary disease.  3. Untreated dyslipidemia.  4. Smoking.   PLAN:  The patient is currently stable on Lovenox and nitroglycerin.  Will add a statin and beta blocker and aspirin and PPI. He will be seen  by Dr. Garen Lah this afternoon.      Abelino Derrick, P.A.      Sheliah Mends, MD  Electronically Signed    LKK/MEDQ  D:  09/19/2008  T:  09/19/2008  Job:  [id number]

## 2010-09-06 NOTE — Discharge Summary (Signed)
NAME:  Jacob Larson, Jacob Larson                 ACCOUNT NO.:  [id number]   MEDICAL RECORD NO.:  [id number]          PATIENT TYPE:  INP   LOCATION:  2021                         FACILITY:  [facility]   PHYSICIAN:  Sheliah Mends, MD      DATE OF BIRTH:  02-24-1966   DATE OF ADMISSION:  09/19/2008  DATE OF DISCHARGE:  09/24/2008                               DISCHARGE SUMMARY   DISCHARGE DIAGNOSES:  1. Non-ST elevation myocardial infarction, stable.  2. Coronary artery disease with percutaneous transluminal coronary      angioplasty and stent deployment with an Endeavor stent to the      right coronary artery, x2 stents reducing initially 80% stenosis      and then 90-95% stenosis to zero.  3. Normal left ventricular function.  Ejection fraction 55-60%.  4. Dyslipidemia, now treated.  5. Tobacco use, instructed on stopping.   DISCHARGE CONDITION:  Improved.   PROCEDURES:  1. Combined left heart catheterization, September 22, 2008 by Dr. Sheliah Mends.  2. September 22, 2008, percutaneous transluminal coronary angioplasty and      stent deployment to the right coronary artery by Dr. Yates Decamp.   DISCHARGE MEDICATIONS:  1. Coreg 3.125 mg twice a day.  2. Crestor 10 mg daily.  3. Pepcid 20 mg daily.  4. Nicotine patch 21 mg for 24 hours, apply every morning and remove      old patch, use 21 mg for 2 weeks then 14 mg per hour patch over-the-      counter.  5. Enteric-coated aspirin 325 mg daily.  6. Plavix 75 mg one daily.  Do not stop.  Stopping could cause a heart      attack.  7. Lisinopril 5 mg tablet, take half a tablet daily.  8. Nitroglycerin 1/150 one under the tongue while sitting for chest      pain, one every 5 minutes up to 3 over 15 minutes.  If no relief,      call 911.   DISCHARGE INSTRUCTIONS:  1. Do not smoke with nicotine patch on.  If he must smoke, take the      patch off for the day.  2. Follow up with Dr. Garen Lah on October 08, 2008 at 10:00 a.m. at      [facility].  3. Return to work not before September 28, 2008.  4. Wash cath site with soap and water.  Call for any bleeding,      swelling, or drainage.  5. Increase activity slowly.  May shower but no lifting for 2 days and      no driving for 2 days.   HISTORY OF PRESENT ILLNESS:  A 45 year old white male who works in [employer]  smokes a pack a day of tobacco had previously been diagnosed with  dyslipidemia, but does not take his Lipitor anymore.  He is followed by  Dr. Merri Brunette.  Was in his usual state of health until the night  before admission, he was awakened with some discomfort in his mid chest  which he thought was indigestion.  He had some radiation to his back  between the shoulders.  He had no associated nausea or vomiting but did  become mildly diaphoretic.  He took aspirin and antacids about 30  minutes after calling with Urgent Care at 11:00 in the morning on Sep 19, 2008 and he was admitted for further evaluation.   PAST MEDICAL HISTORY:  Dyslipidemia status post appendectomy.   MEDICATIONS:  No outpatient medicines prior to discharge.   ALLERGIES:  No known allergies.   FAMILY HISTORY/SOCIAL HISTORY/REVIEW OF SYSTEMS:  See H and P.   PHYSICAL EXAMINATION ON DISCHARGE:  VITAL SIGNS:  Blood pressure 118/75,  pulse 74, respirations 18, and temperature 97.7.  LUNGS:  Clear to auscultation bilaterally.  HEART:  Regular rate and rhythm.  ABDOMEN:  Soft and nontender.  Positive bowel sounds.  EXTREMITIES:  No edema.  Right groin cath site stable.  NEUROLOGIC:  Alert and oriented.   LABORATORY DATA:  Admitting hemoglobin 15.6, hematocrit 44.2, WBC  slightly elevated at 13.9, platelets 208, and neutrophils 84 essentially  remained stable, though his white count did come down, though prior to  discharge hemoglobin 14.9, hematocrit 42.5, WBC 13.5, and platelets 191.   Chemistry on admission:  Sodium 141, potassium 4.2, chloride 107, CO2  24, BUN 7, creatinine 0.69, and glucose  93.  At one point, he however  had hypokalemia and was replaced and prior to discharge sodium 143,  potassium 3.5, chloride 107, CO2 26, BUN 12, creatinine 0.86, and  glucose 121.  Protime on admission 12.1, INR 0.9, and PTT 31.   AST 22, ALT 23, alkaline phos 68, total bili 0.8, lipase 20, albumin  4.0, and calcium 9.5.   UA was clear.  TSH was 2.340.  Drug screen was negative.   Chest x-ray on admission:  Density at the right base, likely reflects  right middle lobe airspace disease.   CT angio of the chest:  No evidence for pulmonary embolus, no focal  airspace disease, and minimal dependent atelectasis bilaterally.   Nuclear stress test:  Unmarkable resting myocardial perfusion study.  The stress portion was cancelled.   EKGs:  Initial EKG, sinus rhythm, right axis deviation, no acute  changes.  Followup EKGs, sinus rhythm, normal EKGs.   Cardiac enzymes initially CK 98 with an MB of 1.6, troponin 0.06.  Troponin bumped up to 0.11, CK 110, MB 1.6 and followup troponin was  0.07 with CK 95 and MB 1.5.   Then post-procedure troponin also elevated to 0.26, 0.43, and 0.40 with  CKs 72-88 and MBs negative at 3.3-5.6.   Cholesterol 278 with LDL 162, HDL 48, and triglycerides 161.   HOSPITAL COURSE:  The patient was admitted with unstable angina and was  placed to the monitor bed.  Nicotine patch was applied.  The patient was  placed on therapeutic Lovenox and IV nitroglycerin.  Troponin became  slightly elevated with plans for cardiac catheterization.  By September 22, 2008, cardiac catheterization revealed stenosis in the RCA undergoing  PTCA and stent deployment as previously described and by September 23, 2008,  the patient was stable but unfortunately his troponin had bumped  somewhat 0.26 up to 0.43.  He was kept an extra night in the hospital  and was ambulated with cardiac rehab.  He did have some mild sinus  tachycardia with ambulation.  By September 24, 2008, he was stable and ready   for discharge home.  He will follow  up as an outpatient.      Darcella Gasman. Annie Paras, N.P.      Sheliah Mends, MD  Electronically Signed    LRI/MEDQ  D:  09/24/2008  T:  09/25/2008  Job:  [id number]   cc:   Soyla Murphy. Renne Crigler, M.D.

## 2012-11-29 ENCOUNTER — Encounter: Payer: Self-pay | Admitting: Internal Medicine

## 2013-06-27 ENCOUNTER — Encounter: Payer: Self-pay | Admitting: *Deleted

## 2013-07-01 ENCOUNTER — Ambulatory Visit: Payer: Self-pay | Admitting: Internal Medicine

## 2013-07-20 ENCOUNTER — Emergency Department (INDEPENDENT_AMBULATORY_CARE_PROVIDER_SITE_OTHER)
Admission: EM | Admit: 2013-07-20 | Discharge: 2013-07-20 | Disposition: A | Discharge status: Home / Self Care | Payer: Managed Care, Other (non HMO) | Source: Home / Self Care | Attending: Family Medicine | Admitting: Family Medicine

## 2013-07-20 ENCOUNTER — Emergency Department (INDEPENDENT_AMBULATORY_CARE_PROVIDER_SITE_OTHER): Payer: Managed Care, Other (non HMO)

## 2013-07-20 ENCOUNTER — Encounter (HOSPITAL_COMMUNITY): Payer: Self-pay | Admitting: Emergency Medicine

## 2013-07-20 DIAGNOSIS — R110 Nausea: Secondary | ICD-10-CM

## 2013-07-20 DIAGNOSIS — R630 Anorexia: Secondary | ICD-10-CM

## 2013-07-20 LAB — POCT URINALYSIS DIP (DEVICE)
Bilirubin Urine: NEGATIVE
Glucose, UA: NEGATIVE mg/dL
HGB URINE DIPSTICK: NEGATIVE
Ketones, ur: NEGATIVE mg/dL
Leukocytes, UA: NEGATIVE
Nitrite: NEGATIVE
PROTEIN: NEGATIVE mg/dL
UROBILINOGEN UA: 0.2 mg/dL (ref 0.0–1.0)
pH: 5.5 (ref 5.0–8.0)

## 2013-07-20 LAB — POCT I-STAT, CHEM 8
BUN: 6 mg/dL (ref 6–23)
CREATININE: 0.8 mg/dL (ref 0.50–1.35)
Calcium, Ion: 1.22 mmol/L (ref 1.12–1.23)
Chloride: 99 mEq/L (ref 96–112)
GLUCOSE: 113 mg/dL — AB (ref 70–99)
HCT: 48 % (ref 39.0–52.0)
HEMOGLOBIN: 16.3 g/dL (ref 13.0–17.0)
Potassium: 4.1 mEq/L (ref 3.7–5.3)
SODIUM: 139 meq/L (ref 137–147)
TCO2: 28 mmol/L (ref 0–100)

## 2013-07-20 MED ORDER — ONDANSETRON HCL 4 MG PO TABS: 4.0000 mg | ORAL_TABLET | Freq: Three times a day (TID) | ORAL | Status: DC | PRN

## 2013-07-20 NOTE — ED Notes (Signed)
C/o body aches, fatigue and dry mouth See physician note

## 2013-07-20 NOTE — Discharge Instructions (Signed)
Your labs and xrays here today were normal. The goal of today's visit was to ensure that you are medically stable. I would encourage you to follow up with Dr. Renne CriglerPharr if your symptoms do not begin to improve over the next few days. Please use zofran as prescribed for nausea.

## 2013-07-20 NOTE — ED Provider Notes (Signed)
CSN: [id number]     Arrival date & time 07/20/13  1531 History   First MD Initiated Contact with Patient 07/20/13 1605     Chief Complaint  Patient presents with  . Generalized Body Aches  . Fatigue   (Consider location/radiation/quality/duration/timing/severity/associated sxs/prior Treatment) HPI Comments: Reports 4-5 days of generalized malaise, myalgias, abdominal bloating, and increased thirst with associated vomiting.  LNBM: this morning PCP: Dr. Renne CriglerPharr Cardiologist: Dr. Rennis GoldenHilty  The history is provided by the patient.    Past Medical History  Diagnosis Date  . NSTEMI (non-ST elevated myocardial infarction) 2010    95% stenosis of distal RCA, stent x2  . Hypertension   . Dyslipidemia    Past Surgical History  Procedure Laterality Date  . Transthoracic echocardiogram  09/20/2008    EF 60-65%, mild LVH, grade 1 diastolic dysfunction; trivial aortic valve regurg  . Nm myocar perf wall motion  09/20/2008    no abnormalities or defects   . Cardiac catheterization  09/22/2008    LAD free of disease, L Cfx free of disease, RCA is large/dominant with 90% distal RCA lesion (Dr. Mariel SleetJ. Eichorn) - PTCA & stenting of mid RCA & distal RCA with 3.0x4224mm Endeavor DES (Dr,. Evlyn CourierJ. Gangi)   Family History  Problem Relation Age of Onset  . Hyperlipidemia Mother   . Heart Problems     History  Substance Use Topics  . Smoking status: Former Smoker    Quit date: 09/23/2008  . Smokeless tobacco: Not on file  . Alcohol Use: Not on file    Review of Systems  Constitutional: Positive for appetite change and fatigue. Negative for fever, chills, diaphoresis, activity change and unexpected weight change.  HENT: Negative.   Eyes: Negative.   Respiratory: Negative.   Cardiovascular: Negative.   Gastrointestinal: Positive for nausea. Negative for vomiting, abdominal pain, diarrhea, constipation, blood in stool, abdominal distention, anal bleeding and rectal pain.       Feels "bloated"  Endocrine:  Positive for polydipsia. Negative for cold intolerance, heat intolerance, polyphagia and polyuria.  Genitourinary: Negative.   Musculoskeletal: Positive for myalgias.  Skin: Negative.   Allergic/Immunologic: Negative for immunocompromised state.  Neurological: Negative.   Psychiatric/Behavioral: Negative.     Allergies  Lisinopril  Home Medications   Current Outpatient Rx  Name  Route  Sig  Dispense  Refill  . aspirin 81 MG tablet   Oral   Take 81 mg by mouth daily.         . bisoprolol (ZEBETA) 5 MG tablet   Oral   Take 5 mg by mouth daily.         . lansoprazole (PREVACID) 30 MG capsule   Oral   Take 30 mg by mouth daily as needed.         . Multiple Vitamin (MULTIVITAMIN) capsule   Oral   Take 1 capsule by mouth daily.         . nitroGLYCERIN (NITROSTAT) 0.4 MG SL tablet   Sublingual   Place 0.4 mg under the tongue every 5 (five) minutes as needed for chest pain.         Marland Kitchen. ondansetron (ZOFRAN) 4 MG tablet   Oral   Take 1 tablet (4 mg total) by mouth every 8 (eight) hours as needed for nausea.   12 tablet   0   . rosuvastatin (CRESTOR) 40 MG tablet   Oral   Take 40 mg by mouth daily.  BP 165/84  Pulse 66  Temp(Src) 98.7 F (37.1 C) (Oral)  Resp 18  SpO2 98% Physical Exam  Nursing note and vitals reviewed. Constitutional: He is oriented to person, place, and time. He appears well-developed and well-nourished. No distress.  HENT:  Head: Normocephalic and atraumatic.  Nose: Nose normal.  Mouth/Throat: Uvula is midline, oropharynx is clear and moist and mucous membranes are normal.  Eyes: Conjunctivae are normal. Right eye exhibits no discharge. Left eye exhibits no discharge. No scleral icterus.  Neck: Normal range of motion. Neck supple. No JVD present. No thyromegaly present.  Cardiovascular: Normal rate, regular rhythm and normal heart sounds.   Pulmonary/Chest: Effort normal and breath sounds normal.  Abdominal: Soft. Bowel sounds  are normal. He exhibits no distension and no mass. There is no tenderness. There is no rebound and no guarding.  Musculoskeletal: Normal range of motion.  Lymphadenopathy:    He has no cervical adenopathy.  Neurological: He is alert and oriented to person, place, and time. He has normal strength. No cranial nerve deficit or sensory deficit. Coordination and gait normal.  Skin: Skin is warm and dry. No rash noted. No erythema. No pallor.  Psychiatric: He has a normal mood and affect. His behavior is normal.    ED Course  Procedures (including critical care time) Labs Review Labs Reviewed  POCT I-STAT, CHEM 8 - Abnormal; Notable for the following:    Glucose, Bld 113 (*)    All other components within normal limits  POCT URINALYSIS DIP (DEVICE)   Imaging Review Dg Abd 2 Views  07/20/2013   CLINICAL DATA:  Diffuse abdominal pain and distension for the past 4 days.  EXAM: ABDOMEN - 2 VIEW  COMPARISON:  None.  FINDINGS: Normal bowel gas pattern. No free peritoneal air. Mild lumbar and lower thoracic spine degenerative changes.  IMPRESSION: No acute abnormality.   Electronically Signed   By: Gordan Payment M.D.   On: 07/20/2013 17:08     MDM   1. Anorexia   2. Nausea     labs and xrays here today were normal. Advised patient goal of today's visit was to ensure medical stability. Encouraged follow up with Dr. Renne Crigler if  symptoms do not begin to improve over the next few days. Please use zofran as prescribed for nausea.   Jess Barters Dougherty, [place] 07/20/13 2029

## 2013-07-23 NOTE — ED Provider Notes (Signed)
Medical screening examination/treatment/procedure(s) were performed by a resident physician or non-physician practitioner and as the supervising physician I was immediately available for consultation/collaboration.  Annali Lybrand, MD    Jaston Havens S Areeb Corron, MD 07/23/13 0750 

## 2013-08-08 ENCOUNTER — Encounter: Payer: Self-pay | Admitting: Internal Medicine

## 2013-08-08 ENCOUNTER — Ambulatory Visit (INDEPENDENT_AMBULATORY_CARE_PROVIDER_SITE_OTHER): Payer: Managed Care, Other (non HMO) | Admitting: Internal Medicine

## 2013-08-08 VITALS — BP 130/80 | HR 61 | Ht 66.0 in | Wt 154.6 lb

## 2013-08-08 DIAGNOSIS — I2510 Atherosclerotic heart disease of native coronary artery without angina pectoris: Secondary | ICD-10-CM

## 2013-08-08 DIAGNOSIS — I10 Essential (primary) hypertension: Secondary | ICD-10-CM

## 2013-08-08 DIAGNOSIS — E785 Hyperlipidemia, unspecified: Secondary | ICD-10-CM

## 2013-08-08 NOTE — Patient Instructions (Signed)
Your physician wants you to follow-up in: 1 year. You will receive a reminder letter in the mail two months in advance. If you don't receive a letter, please call our office to schedule the follow-up appointment.  

## 2013-08-08 NOTE — Progress Notes (Signed)
OFFICE NOTE  Chief Complaint:  No complaints  Primary Care Physician: PHARR,WALTER DAVIDSON, MD  HPI:  Jacob Larson is a pleasant [age] year [place] gentleman with a history of non-ST elevation MI in 2010. He had a 95% stenosis of distal RCA and received 2 stents in that area which were drug eluting. I discontinued his Plavix last year, and he has done well since then. He is active. He stopped smoking a few years ago, and generally he has done well. Denied any shortness of breath, palpitations, presyncope or syncopal symptoms. Recently he has changed his eating habits, increased his exercise and lost about 12 pounds. He looks great in his reports his energy level is much better. His cholesterol has been running higher and is now on Zetia in addition to atorvastatin. His triglycerides have improved significantly. He's also had less problems with acid reflux.  PMHx:  Past Medical History  Diagnosis Date  . NSTEMI (non-ST elevated myocardial infarction) 2010    95% stenosis of distal RCA, stent x2  . Hypertension   . Dyslipidemia     Past Surgical History  Procedure Laterality Date  . Transthoracic echocardiogram  09/20/2008    EF 60-65%, mild LVH, grade 1 diastolic dysfunction; trivial aortic valve regurg  . Nm myocar perf wall motion  09/20/2008    no abnormalities or defects   . Cardiac catheterization  09/22/2008    LAD free of disease, L Cfx free of disease, RCA is large/dominant with 90% distal RCA lesion (Dr. Mariel SleetJ. Eichorn) - PTCA & stenting of mid RCA & distal RCA with 3.0x924mm Endeavor DES (Dr,. Evlyn CourierJ. Gangi)    FAMHx:  Family History  Problem Relation Age of Onset  . Hyperlipidemia Mother   . Heart Problems      SOCHx:   reports that he quit smoking about 4 years ago. He does not have any smokeless tobacco history on file. He reports that he does not drink alcohol or use illicit drugs.  ALLERGIES:  Allergies  Allergen Reactions  . Lisinopril Cough    ROS: A comprehensive  review of systems was negative.  HOME MEDS: Current Outpatient Prescriptions  Medication Sig Dispense Refill  . aspirin 81 MG tablet Take 81 mg by mouth daily.      Marland Kitchen. atenolol (TENORMIN) 50 MG tablet Take 1 tablet by mouth daily.      Marland Kitchen. atorvastatin (LIPITOR) 20 MG tablet Take 1 tablet by mouth daily.      . lansoprazole (PREVACID) 30 MG capsule Take 30 mg by mouth daily as needed.      . nitroGLYCERIN (NITROSTAT) 0.4 MG SL tablet Place 0.4 mg under the tongue every 5 (five) minutes as needed for chest pain.       No current facility-administered medications for this visit.    LABS/IMAGING: No results found for this or any previous visit (from the past 48 hour(s)). No results found.  VITALS: BP 130/80  Pulse 61  Ht 5\' 6"  (1.676 m)  Wt 154 lb 9.6 oz (70.126 kg)  BMI 24.96 kg/m2  EXAM: General appearance: alert and no distress Neck: no carotid bruit and no JVD Lungs: clear to auscultation bilaterally Heart: regular rate and rhythm, S1, S2 normal, no murmur, click, rub or gallop Abdomen: soft, non-tender; bowel sounds normal; no masses,  no organomegaly Extremities: extremities normal, atraumatic, no cyanosis or edema Pulses: 2+ and symmetric Skin: Skin color, texture, turgor normal. No rashes or lesions Neurologic: Grossly normal Psych: Mood, affect normal  EKG:  Normal sinus rhythm at 61  ASSESSMENT: 1. Coronary artery disease status post PCI of the RCA in 2010 2. Dyslipidemia 3. Hypertension  PLAN: 1.   Jacob Larson is doing quite well and is asymptomatic. I commended him on losing about 12 pounds of weight and continuing exercise. He feels great and is doing well. His cholesterol is better controlled now that is gone Lipitor and Zetia. His blood pressures also at goal. Continue to work on exercise to maintain a healthy lifestyle. Plan to see him back in one year.  Jacob NoseKenneth C. Alamin Mccuiston, MD, [facility] Hospital[facility]ACC Attending Cardiologist [facility]  Jacob Larson 08/08/2013, 5:25  PM

## 2014-05-27 ENCOUNTER — Telehealth: Payer: Self-pay | Admitting: Internal Medicine

## 2014-05-28 NOTE — Telephone Encounter (Signed)
This encounter opened in error.  kf

## 2014-08-17 ENCOUNTER — Encounter: Payer: Self-pay | Admitting: Internal Medicine

## 2014-08-17 ENCOUNTER — Ambulatory Visit (INDEPENDENT_AMBULATORY_CARE_PROVIDER_SITE_OTHER): Payer: Managed Care, Other (non HMO) | Admitting: Internal Medicine

## 2014-08-17 VITALS — BP 110/68 | HR 62 | Ht 66.0 in | Wt 157.5 lb

## 2014-08-17 DIAGNOSIS — I10 Essential (primary) hypertension: Secondary | ICD-10-CM

## 2014-08-17 DIAGNOSIS — I2510 Atherosclerotic heart disease of native coronary artery without angina pectoris: Secondary | ICD-10-CM

## 2014-08-17 DIAGNOSIS — I2583 Coronary atherosclerosis due to lipid rich plaque: Secondary | ICD-10-CM

## 2014-08-17 MED ORDER — NITROGLYCERIN 0.4 MG SL SUBL: 0.4000 mg | SUBLINGUAL_TABLET | SUBLINGUAL | Status: DC | PRN

## 2014-08-17 NOTE — Patient Instructions (Signed)
Your physician wants you to follow-up in: 1 year with Dr. Hilty. You will receive a reminder letter in the mail two months in advance. If you don't receive a letter, please call our office to schedule the follow-up appointment.  

## 2014-08-17 NOTE — Progress Notes (Signed)
OFFICE NOTE  Chief Complaint:  No complaints  Primary Care Physician: Londell MohPHARR,WALTER DAVIDSON, MD  HPI:  Jacob Larson is a pleasant 49 year old gentleman with a history of non-ST elevation MI in 2010. He had a 95% stenosis of distal RCA and received 2 stents in that area which were drug eluting. I discontinued his Plavix last year, and he has done well since then. He is active. He stopped smoking a few years ago, and generally he has done well. Denied any shortness of breath, palpitations, presyncope or syncopal symptoms. Recently he has changed his eating habits, increased his exercise and lost about 12 pounds. He looks great in his reports his energy level is much better. His cholesterol has been running higher and is now on Zetia in addition to atorvastatin. His triglycerides have improved significantly. He's also had less problems with acid reflux.  Jacob Larson returns today and is without complaints. He continues to be active and runs 45 times a week. He denies any chest pain or shortness of breath. Cholesterol is been well controlled. His blood pressure is at goal.  PMHx:  Past Medical History  Diagnosis Date  . NSTEMI (non-ST elevated myocardial infarction) 2010    95% stenosis of distal RCA, stent x2  . Hypertension   . Dyslipidemia     Past Surgical History  Procedure Laterality Date  . Transthoracic echocardiogram  09/20/2008    EF 60-65%, mild LVH, grade 1 diastolic dysfunction; trivial aortic valve regurg  . Nm myocar perf wall motion  09/20/2008    no abnormalities or defects   . Cardiac catheterization  09/22/2008    LAD free of disease, L Cfx free of disease, RCA is large/dominant with 90% distal RCA lesion (Dr. Mariel SleetJ. Eichorn) - PTCA & stenting of mid RCA & distal RCA with 3.0x4324mm Endeavor DES (Dr,. Evlyn CourierJ. Gangi)    FAMHx:  Family History  Problem Relation Age of Onset  . Hyperlipidemia Mother   . Heart Problems      SOCHx:   reports that he quit smoking about 5 years  ago. He does not have any smokeless tobacco history on file. He reports that he does not drink alcohol or use illicit drugs.  ALLERGIES:  Allergies  Allergen Reactions  . Lisinopril Cough    ROS: A comprehensive review of systems was negative.  HOME MEDS: Current Outpatient Prescriptions  Medication Sig Dispense Refill  . aspirin 81 MG tablet Take 81 mg by mouth daily.    Marland Kitchen. atenolol (TENORMIN) 50 MG tablet Take 1 tablet by mouth daily.    Marland Kitchen. atorvastatin (LIPITOR) 20 MG tablet Take 1 tablet by mouth daily.    . nitroGLYCERIN (NITROSTAT) 0.4 MG SL tablet Place 1 tablet (0.4 mg total) under the tongue every 5 (five) minutes as needed for chest pain. 25 tablet 3   No current facility-administered medications for this visit.    LABS/IMAGING: No results found for this or any previous visit (from the past 48 hour(s)). No results found.  VITALS: BP 110/68 mmHg  Pulse 62  Ht 5\' 6"  (1.676 m)  Wt 157 lb 8 oz (71.442 kg)  BMI 25.43 kg/m2  EXAM: General appearance: alert and no distress Neck: no carotid bruit and no JVD Lungs: clear to auscultation bilaterally Heart: regular rate and rhythm, S1, S2 normal, no murmur, click, rub or gallop Abdomen: soft, non-tender; bowel sounds normal; no masses,  no organomegaly Extremities: extremities normal, atraumatic, no cyanosis or edema Pulses: 2+ and symmetric Skin:  Skin color, texture, turgor normal. No rashes or lesions Neurologic: Grossly normal Psych: Mood, affect normal  EKG: Normal sinus rhythm at 62  ASSESSMENT: 1. Coronary artery disease status post PCI of the RCA in 2010 2. Dyslipidemia 3. Hypertension  PLAN: 1.   Jacob Larson is doing quite well and is asymptomatic. Blood pressure is well controlled. Cholesterol is followed by his primary care provider is at goal. He's had no further chest pain. We'll give him another prescription for nitroglycerin but has not needed to use it. He should continue to be active and is doing good  job at maintaining his weight. Plan to see him back annually or sooner as necessary.  Chrystie Nose, MD, [facility] Attending Cardiologist [facility]  Yishai Rehfeld C 08/17/2014, 4:43 PM

## 2015-08-23 ENCOUNTER — Encounter: Payer: Self-pay | Admitting: Internal Medicine

## 2015-08-23 ENCOUNTER — Ambulatory Visit (INDEPENDENT_AMBULATORY_CARE_PROVIDER_SITE_OTHER): Payer: Managed Care, Other (non HMO) | Admitting: Internal Medicine

## 2015-08-23 VITALS — BP 116/78 | HR 66 | Ht 66.0 in | Wt 159.0 lb

## 2015-08-23 DIAGNOSIS — E785 Hyperlipidemia, unspecified: Secondary | ICD-10-CM

## 2015-08-23 DIAGNOSIS — I2510 Atherosclerotic heart disease of native coronary artery without angina pectoris: Secondary | ICD-10-CM

## 2015-08-23 DIAGNOSIS — I10 Essential (primary) hypertension: Secondary | ICD-10-CM

## 2015-08-23 NOTE — Patient Instructions (Signed)
Your physician wants you to follow-up in: ONE YEAR with Dr. Hilty. You will receive a reminder letter in the mail two months in advance. If you don't receive a letter, please call our office to schedule the follow-up appointment.  

## 2015-08-23 NOTE — Progress Notes (Signed)
OFFICE NOTE  Chief CoLondell Mohaints  Primary Care Physician: PHARR,WALTER DAVIDSON, MD  HPI:  Jacob Larson is a pleasant 50 year old gentleman with a history of non-ST elevation MI in 2010. He had a 95% stenosis of distal RCA and received 2 stents in that area which were drug eluting. I discontinued his Plavix last year, and he has done well since then. He is active. He stopped smoking a few years ago, and generally he has done well. Denied any shortness of breath, palpitations, presyncope or syncopal symptoms. Recently he has changed his eating habits, increased his exercise and lost about 12 pounds. He looks great in his reports his energy level is much better. His cholesterol has been running higher and is now on Zetia in addition to atorvastatin. His triglycerides have improved significantly. He's also had less problems with acid reflux.  Mr. Jacob Larson returns today and is without complaints. He continues to be active and runs 45 times a week. He denies any chest pain or shortness of breath. Cholesterol is been well controlled. His blood pressure is at goal.  08/23/2015  I saw Mr. Jacob Larson back today and he is without complaints. He is active and runs several times a week. He denies any chest pain or shortness of breath. Cholesterol is at goal. Blood pressure is at goal.  PMHx:  Past Medical History  Diagnosis Date  . NSTEMI (non-ST elevated myocardial infarction) (HCC) 2010    95% stenosis of distal RCA, stent x2  . Hypertension   . Dyslipidemia     Past Surgical History  Procedure Laterality Date  . Transthoracic echocardiogram  09/20/2008    EF 60-65%, mild LVH, grade 1 diastolic dysfunction; trivial aortic valve regurg  . Nm myocar perf wall motion  09/20/2008    no abnormalities or defects   . Cardiac catheterization  09/22/2008    LAD free of disease, L Cfx free of disease, RCA is large/dominant with 90% distal RCA lesion (Dr. Mariel Sleet) - PTCA & stenting of mid RCA &  distal RCA with 3.0x50mm Endeavor DES (Dr,. Evlyn Courier)    FAMHx:  Family History  Problem Relation Age of Onset  . Hyperlipidemia Mother   . Heart Problems      SOCHx:   reports that he quit smoking about 6 years ago. He does not have any smokeless tobacco history on file. He reports that he does not drink alcohol or use illicit drugs.  ALLERGIES:  Allergies  Allergen Reactions  . Lisinopril Cough    ROS: A comprehensive review of systems was negative.  HOME MEDS: Current Outpatient Prescriptions  Medication Sig Dispense Refill  . aspirin 81 MG tablet Take 81 mg by mouth daily.    Marland Kitchen atenolol (TENORMIN) 50 MG tablet Take 1 tablet by mouth daily.    Marland Kitchen atorvastatin (LIPITOR) 20 MG tablet Take 1 tablet by mouth daily.    . nitroGLYCERIN (NITROSTAT) 0.4 MG SL tablet Place 1 tablet (0.4 mg total) under the tongue every 5 (five) minutes as needed for chest pain. 25 tablet 3   No current facility-administered medications for this visit.    LABS/IMAGING: No results found for this or any previous visit (from the past 48 hour(s)). No results found.  VITALS: BP 116/78 mmHg  Pulse 66  Ht  (1.676 m)  Wt 159 lb (72.122 kg)  BMI 25.68 kg/m2  EXAM: General appearance: alert and no distress Neck: no carotid bruit and no JVD Lungs: clear to  auscultation bilaterally Heart: regular rate and rhythm, S1, S2 normal, no murmur, click, rub or gallop Abdomen: soft, non-tender; bowel sounds normal; no masses,  no organomegaly Extremities: extremities normal, atraumatic, no cyanosis or edema Pulses: 2+ and symmetric Skin: Skin color, texture, turgor normal. No rashes or lesions Neurologic: Grossly normal Psych: Mood, affect normal  EKG: Normal sinus rhythm at 66  ASSESSMENT: 1. Coronary artery disease status post PCI of the RCA in 2010 2. Dyslipidemia 3. Hypertension  PLAN: 1.   Mr. Jacob Larson is doing quite well and is asymptomatic. Blood pressure is well controlled. Cholesterol  is followed by his primary care provider is at goal. He's had no further chest pain. We'll give him another prescription for nitroglycerin but has not needed to use it. He should continue to be active and is doing good job at maintaining his weight. Plan to see him back annually or sooner as necessary.  [name], MD, [facility] LakeFACC Attending Cardiologist [facility]   Chrystie NoseKenneth C Hilty 08/23/2015, 3:48 PM

## 2016-01-10 ENCOUNTER — Other Ambulatory Visit: Payer: Self-pay

## 2016-01-10 MED ORDER — NITROGLYCERIN 0.4 MG SL SUBL: 0.4000 mg | SUBLINGUAL_TABLET | SUBLINGUAL | 3 refills | Status: DC | PRN

## 2016-05-17 ENCOUNTER — Encounter: Payer: Self-pay | Admitting: Nurse Practitioner

## 2016-05-17 ENCOUNTER — Ambulatory Visit (INDEPENDENT_AMBULATORY_CARE_PROVIDER_SITE_OTHER): Payer: Managed Care, Other (non HMO) | Admitting: Nurse Practitioner

## 2016-05-17 VITALS — BP 132/80 | HR 87 | Ht 66.0 in | Wt 153.8 lb

## 2016-05-17 DIAGNOSIS — R000 Tachycardia, unspecified: Secondary | ICD-10-CM

## 2016-05-17 DIAGNOSIS — I2510 Atherosclerotic heart disease of native coronary artery without angina pectoris: Secondary | ICD-10-CM

## 2016-05-17 DIAGNOSIS — E782 Mixed hyperlipidemia: Secondary | ICD-10-CM | POA: Diagnosis not present

## 2016-05-17 DIAGNOSIS — I10 Essential (primary) hypertension: Secondary | ICD-10-CM

## 2016-05-17 LAB — BASIC METABOLIC PANEL
BUN: 9 mg/dL (ref 7–25)
CALCIUM: 9.7 mg/dL (ref 8.6–10.3)
CO2: 29 mmol/L (ref 20–31)
Chloride: 103 mmol/L (ref 98–110)
Creat: 0.82 mg/dL (ref 0.60–1.35)
GLUCOSE: 121 mg/dL — AB (ref 65–99)
Potassium: 4.1 mmol/L (ref 3.5–5.3)
Sodium: 142 mmol/L (ref 135–146)

## 2016-05-17 LAB — CBC
HEMATOCRIT: 42.9 % (ref 38.5–50.0)
Hemoglobin: 14.5 g/dL (ref 13.2–17.1)
MCH: 31.2 pg (ref 27.0–33.0)
MCHC: 33.8 g/dL (ref 32.0–36.0)
MCV: 92.3 fL (ref 80.0–100.0)
MPV: 9.3 fL (ref 7.5–12.5)
Platelets: 185 10*3/uL (ref 140–400)
RBC: 4.65 MIL/uL (ref 4.20–5.80)
RDW: 13.7 % (ref 11.0–15.0)
WBC: 11.3 10*3/uL — AB (ref 3.8–10.8)

## 2016-05-17 LAB — TSH: TSH: 0.73 m[IU]/L (ref 0.40–4.50)

## 2016-05-17 MED ORDER — ATENOLOL 25 MG PO TABS: 37.5000 mg | ORAL_TABLET | Freq: Every day | ORAL | 3 refills | Status: DC

## 2016-05-17 NOTE — Patient Instructions (Signed)
Medication Instructions:  Your physician has recommended you make the following change in your medication:  1.  INCREASE the Atenolol to 25 mg taking 1 1/2 tablet daily   Labwork: TODAY:  BMET, CBC, & TSH  Testing/Procedures: None ordered  Follow-Up: Your physician wants you to follow-up in: 6 MONTHS WITH DR. HILTY You will receive a reminder letter in the mail two months in advance. If you don't receive a letter, please call our office to schedule the follow-up appointment.   Any Other Special Instructions Will Be Listed Below (If Applicable).     If you need a refill on your cardiac medications before your next appointment, please call your pharmacy.

## 2016-05-17 NOTE — Progress Notes (Signed)
Office Visit    Patient Name: Janeece RiggersDavid Etzler Date of Encounter: 05/17/2016  Primary Care Provider:  Londell MohPHARR,WALTER DAVIDSON, MD Primary Cardiologist:  C. Hilty, MD   Chief Complaint    51 y/o ? with a h/o CAD, HTN, HL, who presents for f/u related to elevated HRs.  Past Medical History    Past Medical History:  Diagnosis Date  . CAD (coronary artery disease) 2010   a. 2010 NSTEMI/PCI: 95% stenosis of distal RCA, stent x2  . Diastolic dysfunction    a. 08/2008 Echo: EF 60-65%, grade 1 DD, triv AI.  Marland Kitchen. Dyslipidemia   . Hypertension    Past Surgical History:  Procedure Laterality Date  . CARDIAC CATHETERIZATION  09/22/2008   LAD free of disease, L Cfx free of disease, RCA is large/dominant with 90% distal RCA lesion (Dr. Mariel SleetJ. Eichorn) - PTCA & stenting of mid RCA & distal RCA with 3.0x6924mm Endeavor DES (Dr,. Evlyn CourierJ. Gangi)  . NM MYOCAR PERF WALL MOTION  09/20/2008   no abnormalities or defects   . TRANSTHORACIC ECHOCARDIOGRAM  09/20/2008   EF 60-65%, mild LVH, grade 1 diastolic dysfunction; trivial aortic valve regurg    Allergies  Allergies  Allergen Reactions  . Lisinopril Cough    History of Present Illness    51 y/o ? with the above PMH including CAD s/p NSTEMI and RCA stenting in 2010, HTN, and HL.  He was last seen in clinic in May 2017 and has generally done well.  He continues to run about 3x/wk - usually about 2.5 miles over 30 mins.  He is able to do this w/o chest pain or dyspnea.  He has had no recent change in his exercise tolerance.  Over the past month and a half or so, he has noted intermittent elevations in heart rates associated with "pounding."  Rates might rise into the 90's.  This often happens after eating.  He denies presyncope, c/p, or dyspnea during these episodes.  Elevated rates may persist for several mins to several hours.  This does not limit activity and in fact when he goes for a run, he notes that he feels better.  B/c of elevated HR's he scheduled this  appt today.  Of note, when he was last seen, he was taking atenolol 50 mg daily.  He is currently taking 25 mg daily.  He isn't sure why or when the dose was reduced but thinks it might have occurred when he switched pharmacies (local  mail order) sometime in early December, which is when symptoms started.  With recent elevated rates, he has taken an extra 1/2 of atenolol 25 with resolution of Ss.  He did feel rates were elevated in office today. ECG shows sinus rhythm @ a rate of 94.  Further, he recently came off of statin therapy 2/2 body aches.  His pcp has now rx praluent.  Home Medications    Prior to Admission medications   Medication Sig Start Date End Date Taking? Authorizing Provider  lansoprazole (PREVACID) 15 MG capsule Take 15 mg by mouth daily at 12 noon.   Yes Historical Provider, MD  naproxen sodium (ANAPROX) 220 MG tablet Take 220 mg by mouth 2 (two) times daily with a meal.   Yes Historical Provider, MD  aspirin 81 MG tablet Take 81 mg by mouth daily.    Historical Provider, MD  atenolol (TENORMIN) 25 MG tablet Take 1.5 tablets (37.5 mg total) by mouth daily. 05/17/16   Ok Anishristopher R Cheree Fowles,  NP  nitroGLYCERIN (NITROSTAT) 0.4 MG SL tablet Place 1 tablet (0.4 mg total) under the tongue every 5 (five) minutes as needed for chest pain. 01/10/16   Chrystie Nose, MD    Review of Systems    Elevated hr's as above.  He denies chest pain, dyspnea, pnd, orthopnea, n, v, dizziness, syncope, edema, weight gain, or early satiety.   All other systems reviewed and are otherwise negative except as noted above.  Physical Exam    VS:  BP 132/80   Pulse 87   Ht 5\' 6"  (1.676 m)   Wt 153 lb 12.8 oz (69.8 kg)   BMI 24.82 kg/m  , BMI Body mass index is 24.82 kg/m. GEN: Well nourished, well developed, in no acute distress.  HEENT: normal.  Neck: Supple, no JVD, carotid bruits, or masses. Cardiac: RRR, no murmurs, rubs, or gallops. No clubbing, cyanosis, edema.  Radials/DP/PT 2+ and equal  bilaterally.  Respiratory:  Respirations regular and unlabored, clear to auscultation bilaterally. GI: Soft, nontender, nondistended, BS + x 4. MS: no deformity or atrophy. Skin: warm and dry, no rash. Neuro:  Strength and sensation are intact. Psych: Normal affect.  Accessory Clinical Findings    ECG - RSR, 94, no acute st/t changes.  Assessment & Plan    1.  Elevated Heart Rates:  Over the past 2 mos or so, pt has noted occasional elevations of HR to the 90's.  He feels his heart 'pounding' but is otw asymptomatic.  He has had no change in exercise tolerance and in fact Ss improve with exercise.  Ss also improve after taking an additional 12.5 mg of atenolol.  He has symptoms in the office today and HR is 94 on ECG - sinus rhythm.  He is now taking 25 of atenolol whereas he was prev Rx 50 mg daily.  He is not sure why or when dose was dropped.  BPs @ home are often > 130.  In that setting, I have asked him to increase atenolol up to 37.5 mg daily.  I will check a cbc, bmet, and TSH today just to be complete.  2.  CAD:  No chest pain.  Good exercise tolerance -still running regularly.  Cont asa,  blocker.  He is off statin in the setting of muscle aches and will be placed on praluent by pcp.  3.  HL:  As above  off statin in setting of body aches.  To start praluent.  Followed by PCP.  4.  Dispo:  F/u labs as above.  F/u Dr. Rennis Golden in 6 mos or sooner if necessary.  Nicolasa Ducking, NP 05/17/2016, 5:00 PM

## 2016-08-24 ENCOUNTER — Ambulatory Visit (INDEPENDENT_AMBULATORY_CARE_PROVIDER_SITE_OTHER): Payer: Managed Care, Other (non HMO) | Admitting: Internal Medicine

## 2016-08-24 ENCOUNTER — Encounter: Payer: Self-pay | Admitting: Internal Medicine

## 2016-08-24 VITALS — BP 110/74 | HR 71 | Ht 66.0 in | Wt 159.0 lb

## 2016-08-24 DIAGNOSIS — I10 Essential (primary) hypertension: Secondary | ICD-10-CM

## 2016-08-24 DIAGNOSIS — I2510 Atherosclerotic heart disease of native coronary artery without angina pectoris: Secondary | ICD-10-CM | POA: Diagnosis not present

## 2016-08-24 DIAGNOSIS — E782 Mixed hyperlipidemia: Secondary | ICD-10-CM

## 2016-08-24 NOTE — Progress Notes (Signed)
OFFICE NOTE  Chief Complaint:  No complaints  Primary Care Physician: Jacob Moh, MD  HPI:  Jacob Larson is a pleasant 51 year old gentleman with a history of non-ST elevation MI in 2010. He had a 95% stenosis of distal RCA and received 2 stents in that area which were drug eluting. I discontinued his Plavix last year, and he has done well since then. He is active. He stopped smoking a few years ago, and generally he has done well. Denied any shortness of breath, palpitations, presyncope or syncopal symptoms. Recently he has changed his eating habits, increased his exercise and lost about 12 pounds. He looks great in his reports his energy level is much better. His cholesterol has been running higher and is now on Zetia in addition to atorvastatin. His triglycerides have improved significantly. He's also had less problems with acid reflux.  Jacob Larson returns today and is without complaints. He continues to be active and runs 45 times a week. He denies any chest pain or shortness of breath. Cholesterol is been well controlled. His blood pressure is at goal.  08/23/2015  I saw Jacob Larson back today and he is without complaints. He is active and runs several times a week. He denies any chest pain or shortness of breath. Cholesterol is at goal. Blood pressure is at goal.  08/24/2016  Jacob Larson returns today for follow-up. Overall seems to be doing well. He denies chest pain worsening shortness of breath. He continues to be active and exercises regularly. Cholesterol was well controlled however he was intolerant of statin medications and he said that he had significant side effects, however they were mostly neurocognitive including confusion. Based on this he was switched to Praluent. He's now been on that for 3 weeks and seems to be tolerating it.  PMHx:  Past Medical History:  Diagnosis Date  . CAD (coronary artery disease) 2010   a. 2010 NSTEMI/PCI: 95% stenosis of distal RCA,  stent x2  . Diastolic dysfunction    a. 08/2008 Echo: EF 60-65%, grade 1 DD, triv AI.  Marland Kitchen Dyslipidemia   . Hypertension     Past Surgical History:  Procedure Laterality Date  . CARDIAC CATHETERIZATION  09/22/2008   LAD free of disease, L Cfx free of disease, RCA is large/dominant with 90% distal RCA lesion (Dr. Mariel Sleet) - PTCA & stenting of mid RCA & distal RCA with 3.0x40mm Endeavor DES (Dr,. Evlyn Courier)  . NM MYOCAR PERF WALL MOTION  09/20/2008   no abnormalities or defects   . TRANSTHORACIC ECHOCARDIOGRAM  09/20/2008   EF 60-65%, mild LVH, grade 1 diastolic dysfunction; trivial aortic valve regurg    FAMHx:  Family History  Problem Relation Age of Onset  . Hyperlipidemia Mother   . Heart Problems      SOCHx:   reports that he quit smoking about 7 years ago. He has never used smokeless tobacco. He reports that he does not drink alcohol or use drugs.  ALLERGIES:  Allergies  Allergen Reactions  . Lisinopril Cough    ROS: A comprehensive review of systems was negative.  HOME MEDS: Current Outpatient Prescriptions  Medication Sig Dispense Refill  . aspirin 81 MG tablet Take 81 mg by mouth daily.    Marland Kitchen atenolol (TENORMIN) 25 MG tablet Take 1.5 tablets (37.5 mg total) by mouth daily. 135 tablet 3  . lansoprazole (PREVACID) 15 MG capsule Take 15 mg by mouth as needed.     . nitroGLYCERIN (NITROSTAT) 0.4 MG  SL tablet Place 1 tablet (0.4 mg total) under the tongue every 5 (five) minutes as needed for chest pain. 25 tablet 3  . PRALUENT 150 MG/ML SOPN Inject 1 mL as directed every 14 (fourteen) days.     No current facility-administered medications for this visit.     LABS/IMAGING: No results found for this or any previous visit (from the past 48 hour(s)). No results found.  VITALS: BP 110/74 (BP Location: Right Arm, Patient Position: Sitting)   Pulse 71   Ht 5\' 6"  (1.676 m)   Wt 159 lb (72.1 kg)   BMI 25.66 kg/m   EXAM: General appearance: alert and no distress Neck:  no carotid bruit and no JVD Lungs: clear to auscultation bilaterally Heart: regular rate and rhythm, S1, S2 normal, no murmur, click, rub or gallop Abdomen: soft, non-tender; bowel sounds normal; no masses,  no organomegaly Extremities: extremities normal, atraumatic, no cyanosis or edema Pulses: 2+ and symmetric Skin: Skin color, texture, turgor normal. No rashes or lesions Neurologic: Grossly normal Psych: Mood, affect normal  EKG: Normal sinus rhythm at 71  ASSESSMENT: 1. Coronary artery disease status post PCI of the RCA in 2010 2. Dyslipidemia - statin intolerant, on Praluent 3. Hypertension 4. Family history of premature CAD in father - TC 42>400  PLAN: 1.   [name] is doing very well without any recurrent chest pain symptoms. He remains active and is following a strict diet. His weight is appropriate. Blood pressure is well-controlled. He was intolerant to statins due to neurocognitive effects and now is on Praluent, per his PCP's office. I look forward to seeing his most recent cholesterol profile in a few weeks. No changes to his medicines today and follow-up with me annually or sooner as necessary.  Chrystie NoseKenneth C. Rohin Krejci, MD, [facility] HospitalFACC Attending Cardiologist [facility]   Chrystie NoseKenneth C Sopheap Boehle 08/24/2016, 2:41 PM

## 2016-08-24 NOTE — Patient Instructions (Signed)
Your physician wants you to follow-up in: ONE YEAR with Dr. Hilty. You will receive a reminder letter in the mail two months in advance. If you don't receive a letter, please call our office to schedule the follow-up appointment.  

## 2017-04-10 ENCOUNTER — Other Ambulatory Visit: Payer: Self-pay | Admitting: Gastroenterology

## 2017-04-10 DIAGNOSIS — R1011 Right upper quadrant pain: Secondary | ICD-10-CM

## 2017-04-10 DIAGNOSIS — R1012 Left upper quadrant pain: Secondary | ICD-10-CM

## 2017-04-27 ENCOUNTER — Ambulatory Visit
Admission: RE | Admit: 2017-04-27 | Discharge: 2017-04-27 | Disposition: A | Discharge status: Home / Self Care | Payer: 59 | Source: Ambulatory Visit | Attending: Gastroenterology | Admitting: Gastroenterology

## 2017-04-27 DIAGNOSIS — R1011 Right upper quadrant pain: Secondary | ICD-10-CM

## 2017-04-27 DIAGNOSIS — R1012 Left upper quadrant pain: Secondary | ICD-10-CM

## 2017-05-04 ENCOUNTER — Other Ambulatory Visit: Payer: Self-pay | Admitting: Gastroenterology

## 2017-05-04 DIAGNOSIS — R1010 Upper abdominal pain, unspecified: Secondary | ICD-10-CM

## 2017-05-08 ENCOUNTER — Ambulatory Visit
Admission: RE | Admit: 2017-05-08 | Discharge: 2017-05-08 | Disposition: A | Discharge status: Home / Self Care | Payer: 59 | Source: Ambulatory Visit | Attending: Gastroenterology | Admitting: Gastroenterology

## 2017-05-08 DIAGNOSIS — R1010 Upper abdominal pain, unspecified: Secondary | ICD-10-CM

## 2017-05-08 MED ORDER — IOPAMIDOL (ISOVUE-300) INJECTION 61%
100.0000 mL | Freq: Once | INTRAVENOUS | Status: AC | PRN
  Administered 2017-05-08: 100 mL via INTRAVENOUS

## 2017-05-10 ENCOUNTER — Other Ambulatory Visit: Payer: Self-pay | Admitting: Nurse Practitioner

## 2018-03-19 ENCOUNTER — Ambulatory Visit (INDEPENDENT_AMBULATORY_CARE_PROVIDER_SITE_OTHER): Payer: 59 | Admitting: Internal Medicine

## 2018-03-19 ENCOUNTER — Encounter: Payer: Self-pay | Admitting: Internal Medicine

## 2018-03-19 VITALS — BP 124/78 | HR 69 | Ht 66.0 in | Wt 158.8 lb

## 2018-03-19 DIAGNOSIS — I10 Essential (primary) hypertension: Secondary | ICD-10-CM | POA: Diagnosis not present

## 2018-03-19 DIAGNOSIS — I2510 Atherosclerotic heart disease of native coronary artery without angina pectoris: Secondary | ICD-10-CM

## 2018-03-19 DIAGNOSIS — E782 Mixed hyperlipidemia: Secondary | ICD-10-CM | POA: Diagnosis not present

## 2018-03-19 NOTE — Patient Instructions (Signed)
Medication Instructions:  Continue current medications If you need a refill on your cardiac medications before your next appointment, please call your pharmacy.   Lab work: NONE  Testing/Procedures: NONE  Follow-Up: At [facility], you and your health needs are our priority.  As part of our continuing mission to provide you with exceptional heart care, we have created designated Provider Care Teams.  These Care Teams include your primary Cardiologist (physician) and Advanced Practice Providers (APPs -  Physician Assistants and Nurse Practitioners) who all work together to provide you with the care you need, when you need it. You will need a follow up appointment in 12 months.  Please call our office 2 months in advance to schedule this appointment.  You may see Dr. Hilty or one of the following Advanced Practice Providers on your designated Care Team: Hao Meng, PA-C . Angela Duke, PA-C  Any Other Special Instructions Will Be Listed Below (If Applicable).    

## 2018-03-19 NOTE — Progress Notes (Signed)
OFFICE NOTE  Chief Complaint:  Routine follow-up  Primary Care Physician: Merri Brunette, MD  HPI:  Jacob Larson is a pleasant 52 year old gentleman with a history of non-ST elevation MI in 2010. He had a 95% stenosis of distal RCA and received 2 stents in that area which were drug eluting. I discontinued his Plavix last year, and he has done well since then. He is active. He stopped smoking a few years ago, and generally he has done well. Denied any shortness of breath, palpitations, presyncope or syncopal symptoms. Recently he has changed his eating habits, increased his exercise and lost about 12 pounds. He looks great in his reports his energy level is much better. His cholesterol has been running higher and is now on Zetia in addition to atorvastatin. His triglycerides have improved significantly. He's also had less problems with acid reflux.  Jacob Larson returns today and is without complaints. He continues to be active and runs 45 times a week. He denies any chest pain or shortness of breath. Cholesterol is been well controlled. His blood pressure is at goal.  08/23/2015  I saw Jacob Larson back today and he is without complaints. He is active and runs several times a week. He denies any chest pain or shortness of breath. Cholesterol is at goal. Blood pressure is at goal.  08/24/2016  Jacob Larson returns today for follow-up. Overall seems to be doing well. He denies chest pain worsening shortness of breath. He continues to be active and exercises regularly. Cholesterol was well controlled however he was intolerant of statin medications and he said that he had significant side effects, however they were mostly neurocognitive including confusion. Based on this he was switched to Praluent. He's now been on that for 3 weeks and seems to be tolerating it.  03/19/2018  Jacob Larson is seen today for routine follow-up.  Is been over a year since I last saw him.  Symptomatically he is doing well.   Denies any chest pain or worsening shortness of breath.  He had labs recently from his PCP for follow-up of dyslipidemia on Praluent.  His total cholesterol was 144, triglycerides 102, HDL 70 and LDL 54.  He denies any chest pain or worsening shortness of breath.  EKG shows normal sinus rhythm.  He did note that his father had heart attack in his 70s and for Jacob Larson also premature onset coronary disease.  He does have one daughter who is age is 22.  She has not had a screening cholesterol yet however I recommended it.  There may be a familial hyperlipidemia.  His untreated LDL cholesterol was in the 140s.  We could also consider genetic testing.  He would be more interested if his daughter screening cholesterol was high.  PMHx:  Past Medical History:  Diagnosis Date  . CAD (coronary artery disease) 2010   a. 2010 NSTEMI/PCI: 95% stenosis of distal RCA, stent x2  . Diastolic dysfunction    a. 08/2008 Echo: EF 60-65%, grade 1 DD, triv AI.  Marland Kitchen Dyslipidemia   . Hypertension     Past Surgical History:  Procedure Laterality Date  . CARDIAC CATHETERIZATION  09/22/2008   LAD free of disease, L Cfx free of disease, RCA is large/dominant with 90% distal RCA lesion (Dr. Mariel Sleet) - PTCA & stenting of mid RCA & distal RCA with 3.0x79mm Endeavor DES (Dr,. Evlyn Courier)  . NM MYOCAR PERF WALL MOTION  09/20/2008   no abnormalities or defects   .  TRANSTHORACIC ECHOCARDIOGRAM  09/20/2008   EF 60-65%, mild LVH, grade 1 diastolic dysfunction; trivial aortic valve regurg    FAMHx:  Family History  Problem Relation Age of Onset  . Hyperlipidemia Mother   . Heart Problems Unknown     SOCHx:   reports that he quit smoking about 9 years ago. He has never used smokeless tobacco. He reports that he does not drink alcohol or use drugs.  ALLERGIES:  Allergies  Allergen Reactions  . Lisinopril Cough    ROS: A comprehensive review of systems was negative.  HOME MEDS: Current Outpatient Medications    Medication Sig Dispense Refill  . aspirin 81 MG tablet Take 81 mg by mouth daily.    Marland Kitchen. atenolol (TENORMIN) 25 MG tablet TAKE 1.5 TABLETS (37.5 MG TOTAL) BY MOUTH DAILY. 135 tablet 3  . lansoprazole (PREVACID) 15 MG capsule Take 15 mg by mouth as needed.     . nitroGLYCERIN (NITROSTAT) 0.4 MG SL tablet Place 1 tablet (0.4 mg total) under the tongue every 5 (five) minutes as needed for chest pain. 25 tablet 3  . PRALUENT 150 MG/ML SOPN Inject 1 mL as directed every 14 (fourteen) days.     No current facility-administered medications for this visit.     LABS/IMAGING: No results found for this or any previous visit (from the past 48 hour(s)). No results found.  VITALS: BP 124/78   Pulse 69   Ht 5\' 6"  (1.676 m)   Wt 158 lb 12.8 oz (72 kg)   BMI 25.63 kg/m   EXAM: General appearance: alert and no distress Neck: no carotid bruit and no JVD Lungs: clear to auscultation bilaterally Heart: regular rate and rhythm, S1, S2 normal, no murmur, click, rub or gallop Abdomen: soft, non-tender; bowel sounds normal; no masses,  no organomegaly Extremities: extremities normal, atraumatic, no cyanosis or edema Pulses: 2+ and symmetric Skin: Skin color, texture, turgor normal. No rashes or lesions Neurologic: Grossly normal Psych: Mood, affect normal  EKG: Normal sinus rhythm 69-personally reviewed  ASSESSMENT: 1. Coronary artery disease status post PCI of the RCA in 2010 2. Dyslipidemia - statin intolerant, on Praluent 3. Hypertension 4. Family history of premature CAD in father - TC >400 5. Possible familial hyperlipidemia  PLAN: 1.   Jacob Larson is doing well and asymptomatic now 9 years after stenting however did have premature onset coronary disease in his early 3440s and his father had an MI in his 8630s.  His cholesterol was apparently over 400 and at times Jacob Larson's total cholesterol was over 300.  He has a daughter who has not had lipid testing but I encouraged that.  She may be a  candidate for therapy and if her lipids are high I would advocate for genetic testing and him and her.  Then follow-up with me annually or sooner as necessary.  Jacob NoseKenneth C. Avyukth Bontempo, MD, [facility] HospitalFACC, FACP  [place]  [facility]  Medical Director of the [facility]  [facility] Diplomate of the American Board of Clinical Lipidology Attending Cardiologist  Direct Dial: [phone]  Fax: [phone]  Website:  [email or web address]    Jacob Larson C Lonn Im 03/19/2018, 11:39 AM

## 2018-04-17 IMAGING — CT CT ABD-PELV W/ CM
1 of 3 series · 14 of 32 positions shown, 19 images · IV contrast (APPLIED)
Comparison: None.

CLINICAL DATA: Generalized abdominal pain for 4 months. History of
appendectomy.

EXAM:
CT ABDOMEN AND PELVIS WITH CONTRAST
TECHNIQUE: Multidetector CT imaging of the abdomen and pelvis was performed
using the standard protocol following bolus administration of
intravenous contrast.
CONTRAST:  100mL HWKHVM-AII IOPAMIDOL (HWKHVM-AII) INJECTION 61%

[Series 2: abd/pelvis w/cm · axial · 0.70mm/px · z∈[-509,-109]mm · 14 of 90 slices shown, 19 images]
[im 5/90  soft-tissue]
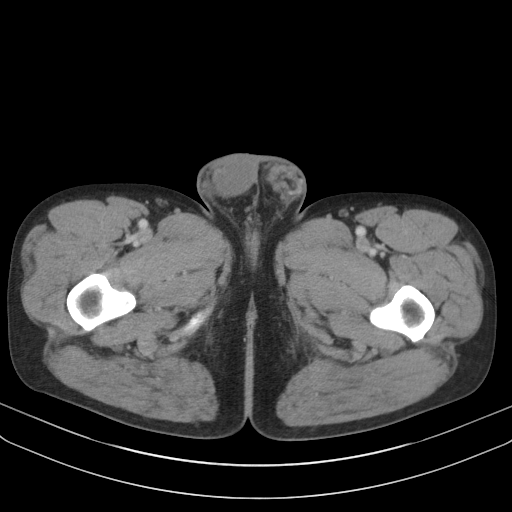
[im 5/90  bone]
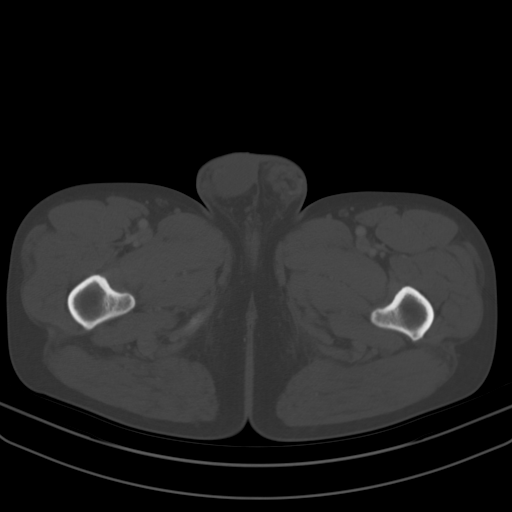
[im 15/90  soft-tissue]
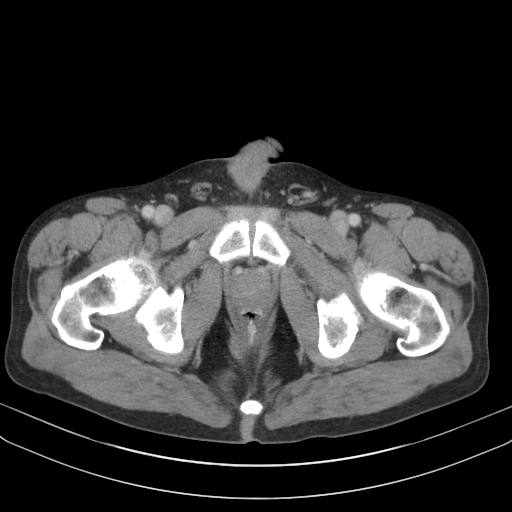
[im 19/90  soft-tissue]
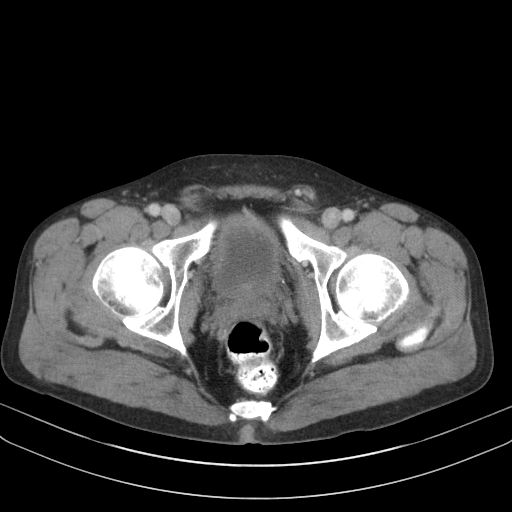
[im 24/90  soft-tissue]
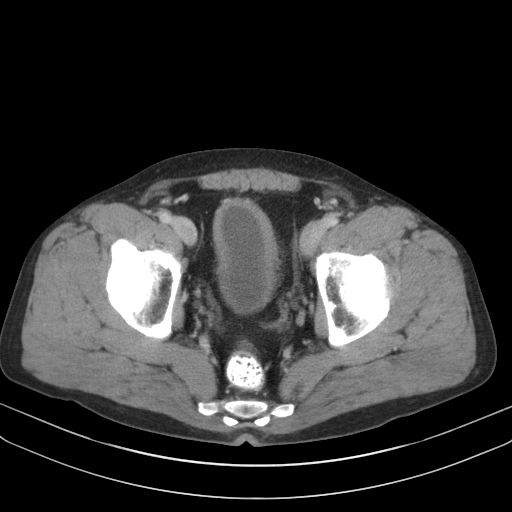
[im 33/90  soft-tissue]
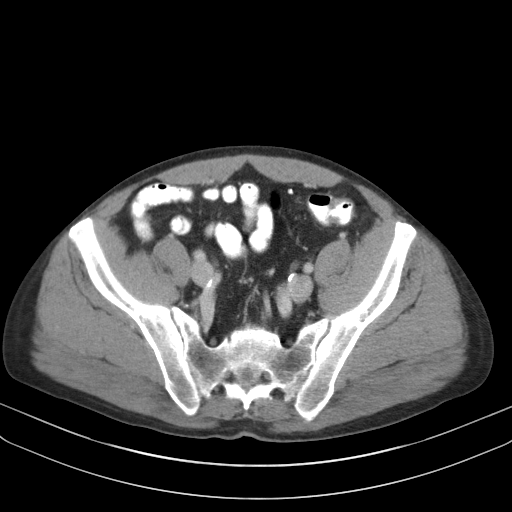
[im 38/90  soft-tissue]
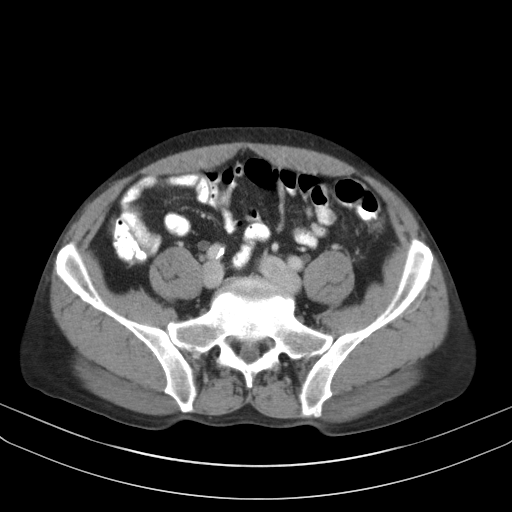
[im 47/90  soft-tissue]
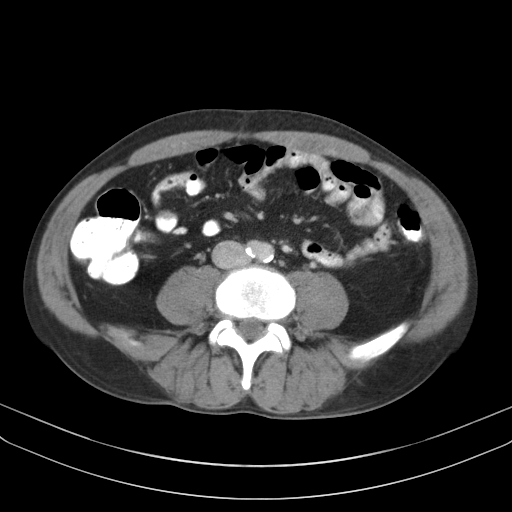
[im 52/90  soft-tissue]
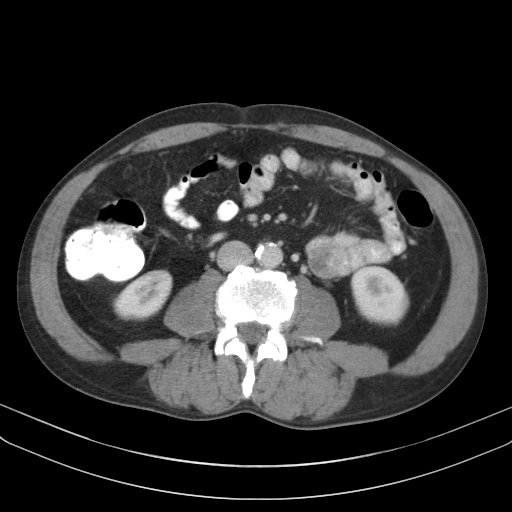
[im 57/90  soft-tissue]
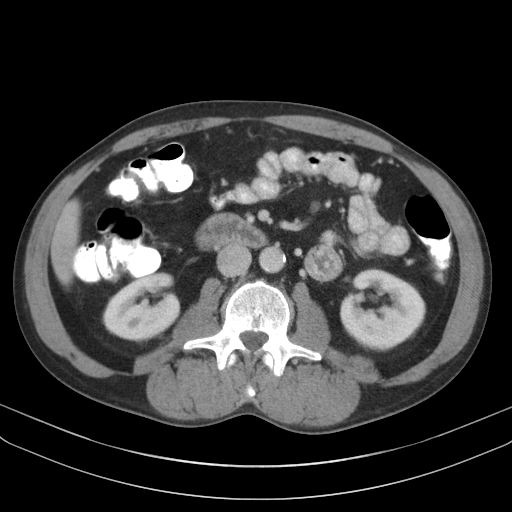
[im 57/90  bone]
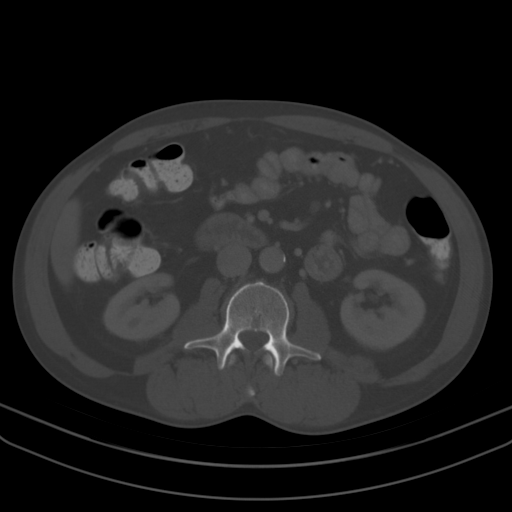
[im 66/90  soft-tissue]
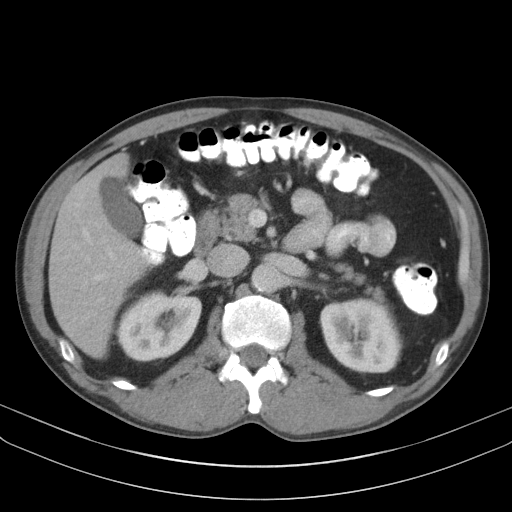
[im 71/90  soft-tissue]
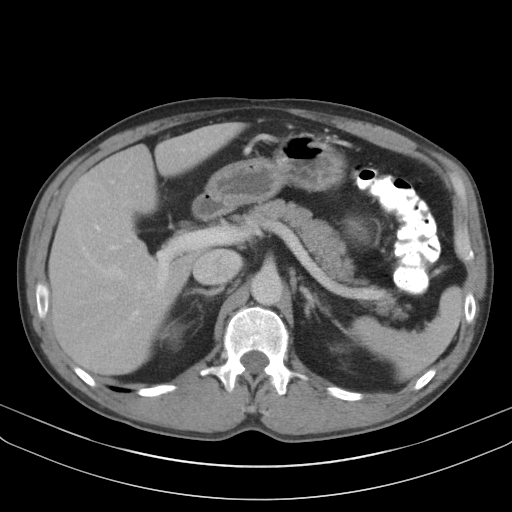
[im 71/90  lung]
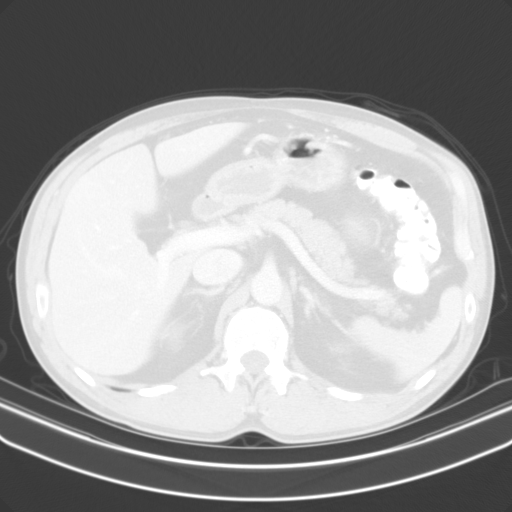
[im 75/90  soft-tissue]
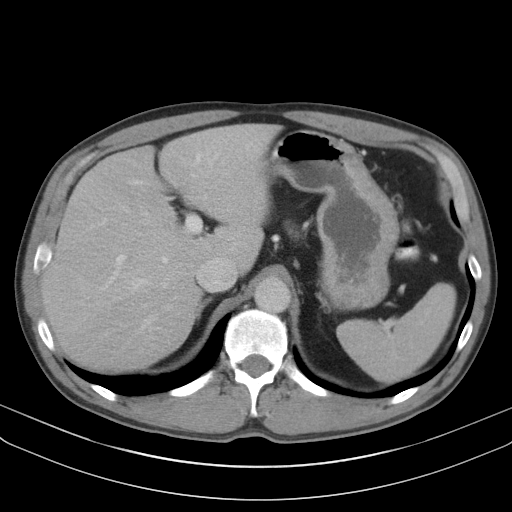
[im 75/90  lung]
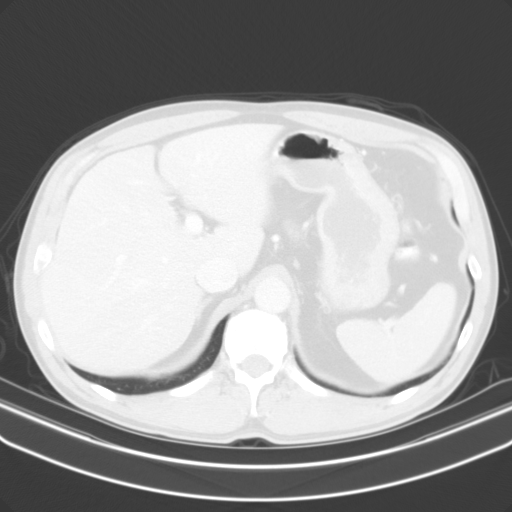
[im 80/90  lung]
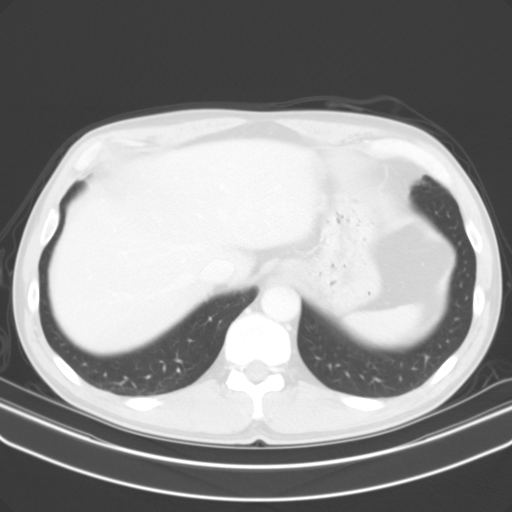
[im 85/90  soft-tissue]
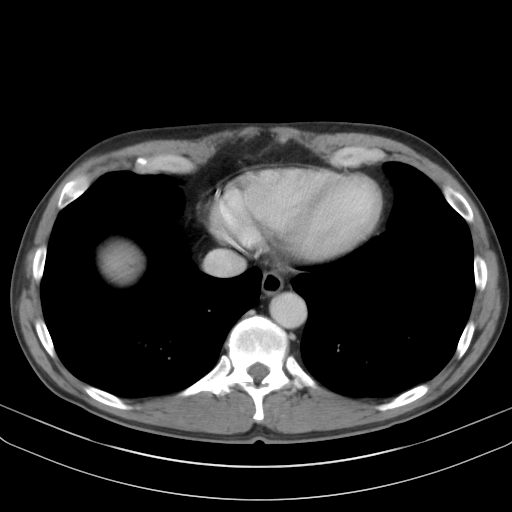
[im 85/90  lung]
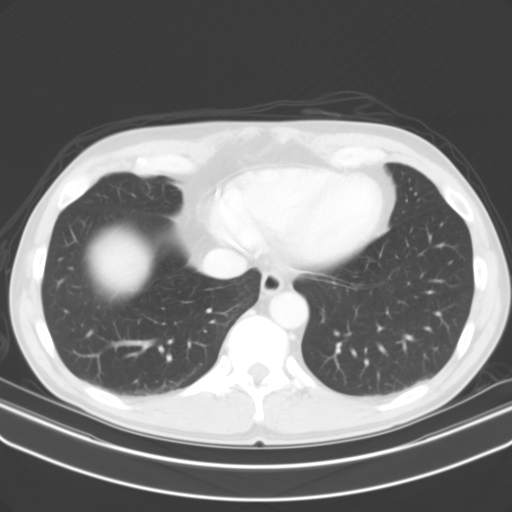

[14 of 32 positions shown; findings below may reference images not displayed]

FINDINGS: Lower chest: No acute abnormality.

Hepatobiliary: No focal liver abnormality is seen. No gallstones,
gallbladder wall thickening, or biliary dilatation.

Pancreas: Unremarkable. No pancreatic ductal dilatation or
surrounding inflammatory changes.

Spleen: Normal in size without focal abnormality.

Adrenals/Urinary Tract: Adrenal glands appear normal. Kidneys are
unremarkable without suspicious mass, stone or hydronephrosis. No
ureteral or bladder calculi identified. Bladder walls appear
thickened circumferentially but difficult to characterize due to
bladder decompression.

Stomach/Bowel: No dilated large or small bowel loops. No convincing
bowel wall thickening or evidence of bowel wall inflammation,
questionable mild thickening of the walls of the sigmoid colon.
Stomach appears normal.

Vascular/Lymphatic: Aortic atherosclerosis. No enlarged abdominal or
pelvic lymph nodes.

Reproductive: Prostate is unremarkable.

Other: No free fluid or abscess collection. No free intraperitoneal
air.

Musculoskeletal: Mild degenerative spurring within the lumbar spine.
No acute or suspicious osseous finding.
IMPRESSION: 1. No definite abnormality identified within the abdomen or pelvis.
Questionable mild thickening of the walls of the sigmoid colon,
raising the possibility of mild infectious or inflammatory colitis.
Questionable mild circumferential thickening of the walls of the
bladder, likely accentuated by the bladder decompression. Consider
colonoscopy and urinalysis.
2. Aortic atherosclerosis.

## 2018-05-18 ENCOUNTER — Other Ambulatory Visit: Payer: Self-pay | Admitting: Internal Medicine

## 2018-11-11 IMAGING — US US ABDOMEN COMPLETE
1 series · 14 of 25 positions shown · non-contrast
Comparison: Abdominal radiographs 03/14/2017

CLINICAL DATA: Right upper quadrant and left upper quadrant
abdominal pain.

EXAM:
ABDOMEN ULTRASOUND COMPLETE

[Series 1: us abdomen complete · 0.23mm/px · 14 of 90 slices shown]
[im 1/90]
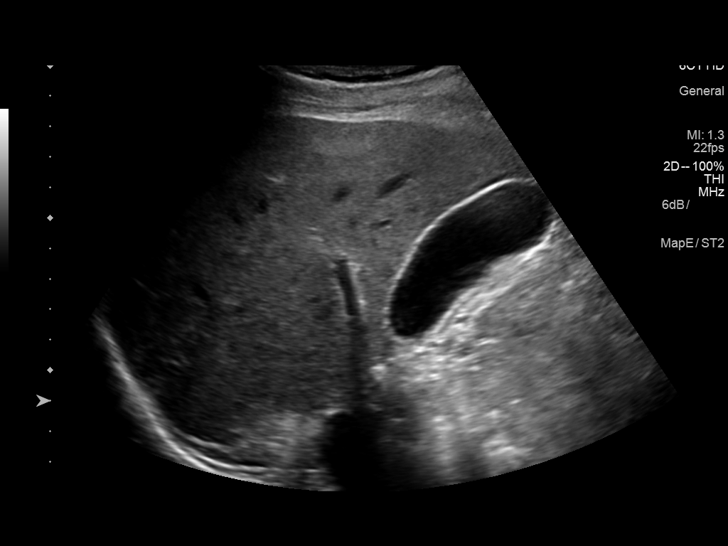
[im 8/90]
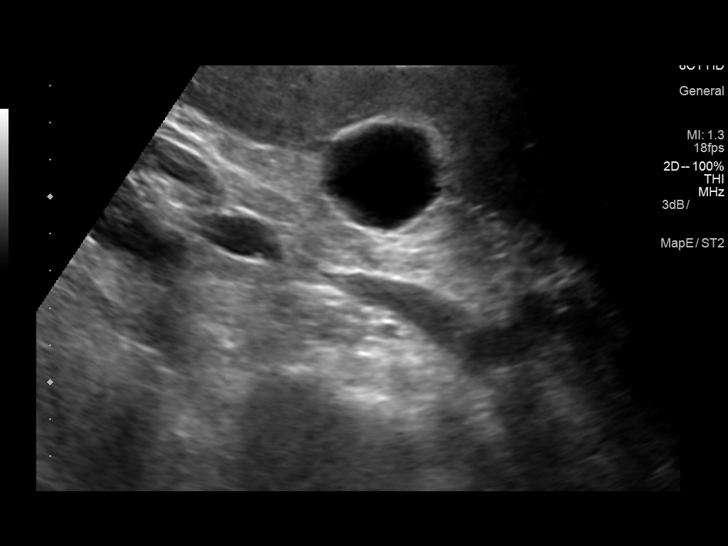
[im 15/90]
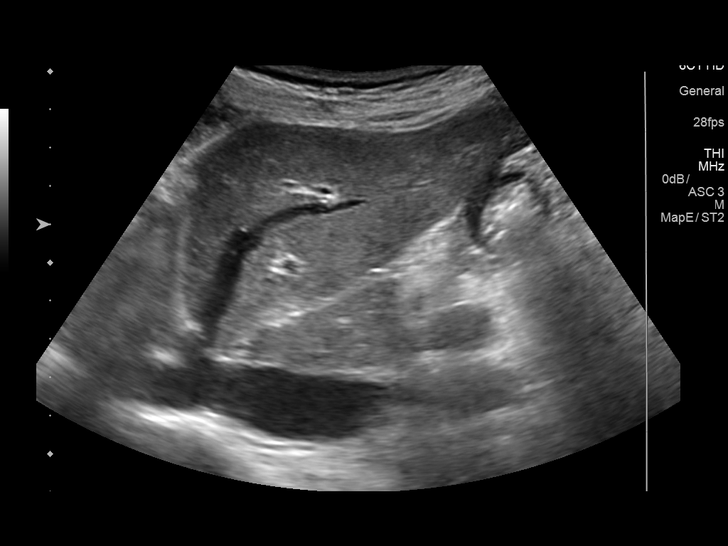
[im 23/90]
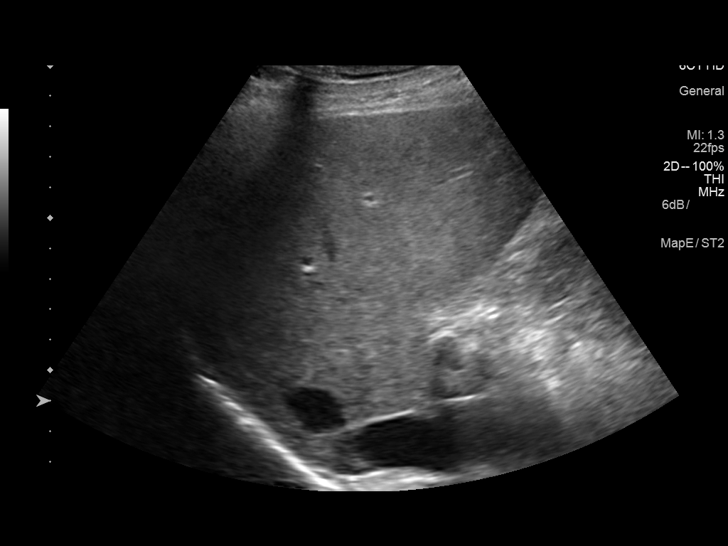
[im 30/90]
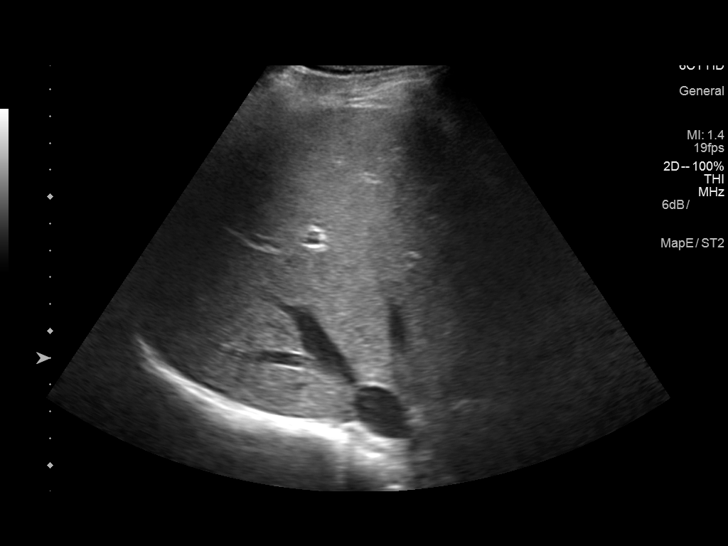
[im 34/90]
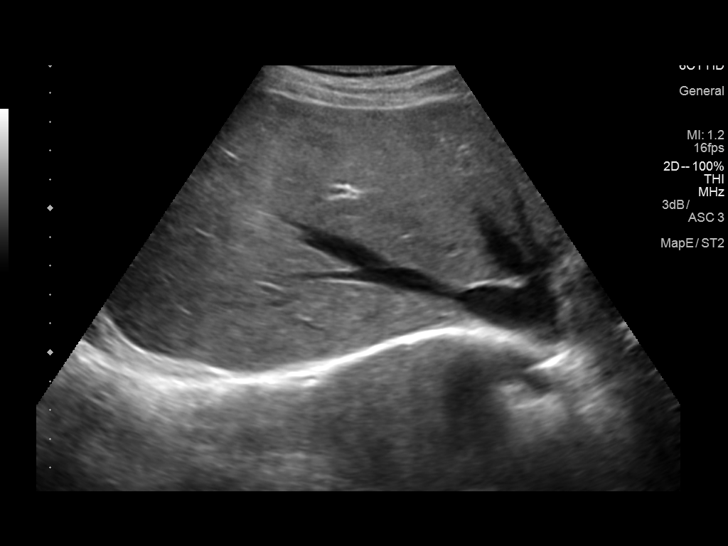
[im 41/90]
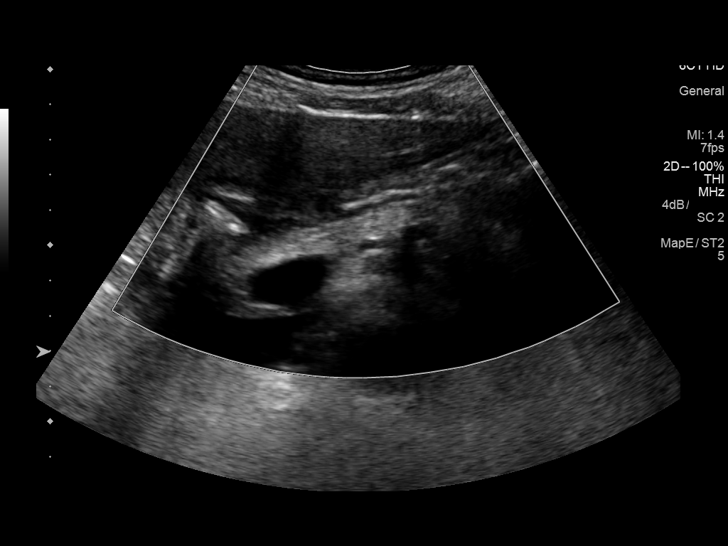
[im 49/90]
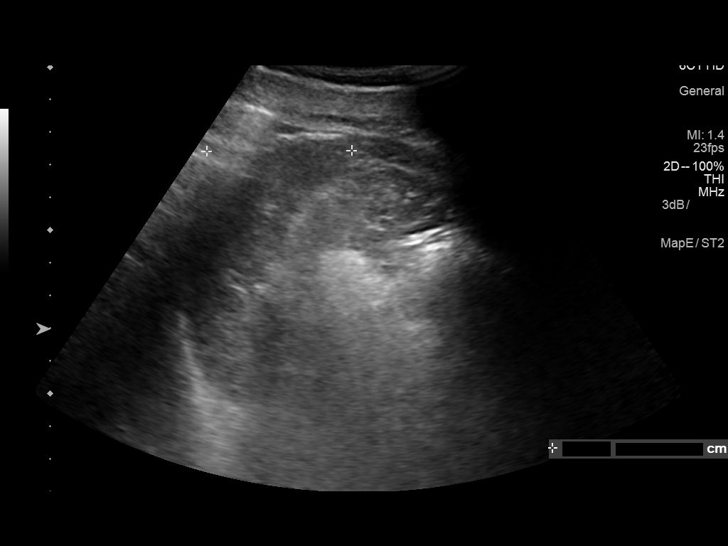
[im 56/90]
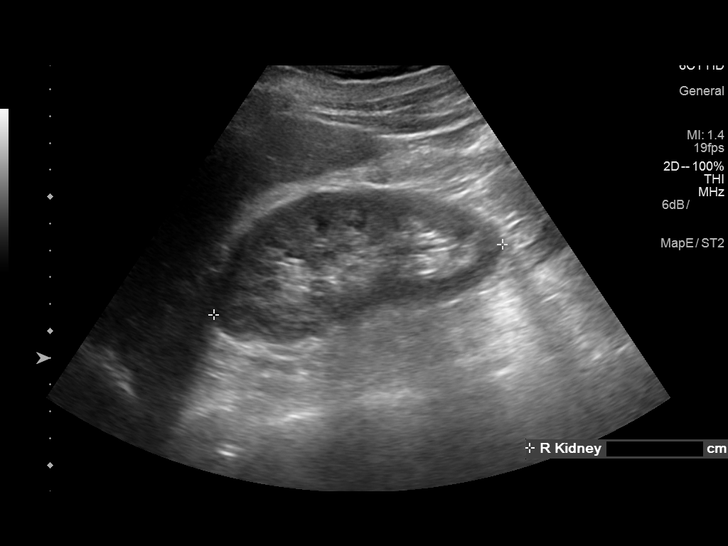
[im 60/90]
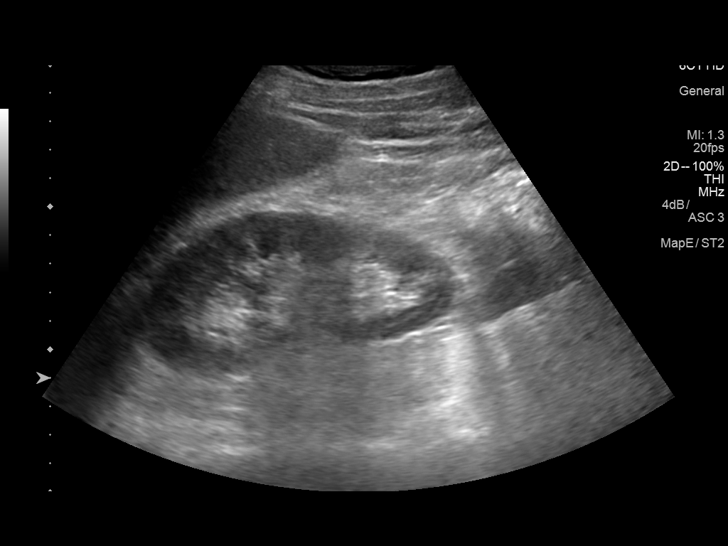
[im 67/90]
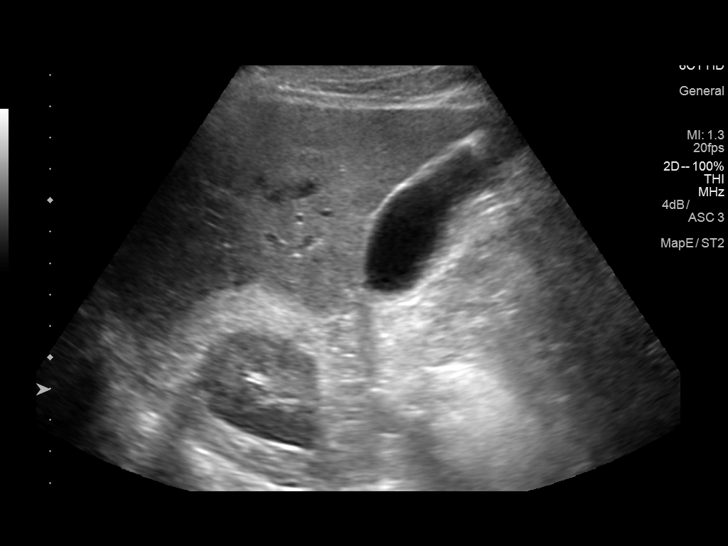
[im 75/90]
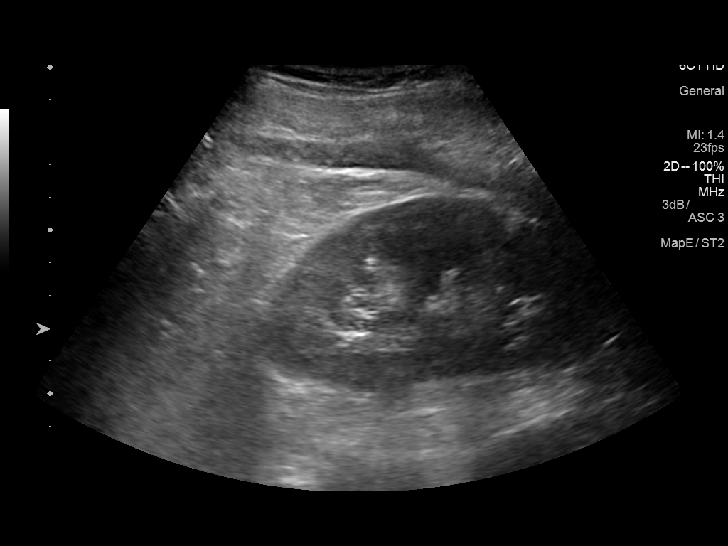
[im 82/90]
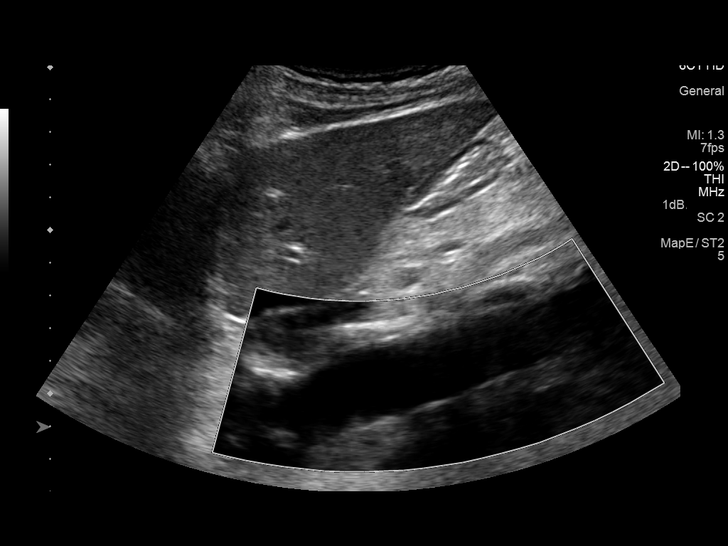
[im 90/90]
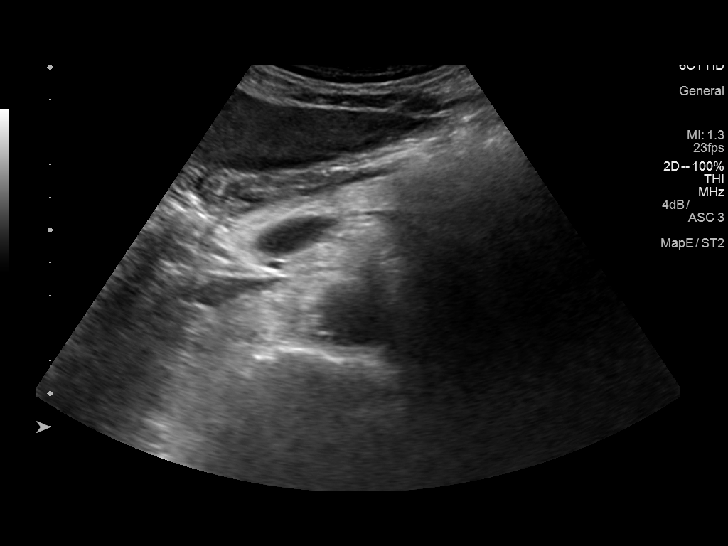

[14 of 25 positions shown; findings below may reference images not displayed]

FINDINGS: Gallbladder: No gallstones or wall thickening visualized. No
sonographic Murphy sign noted by sonographer. Maximal wall thickness
is 2.4 mm, within normal limits.

Common bile duct: Diameter: 4.2 mm, within normal limits

Liver: No focal lesion identified. Within normal limits in
parenchymal echogenicity. Portal vein is patent on color Doppler
imaging with normal direction of blood flow towards the liver.

IVC: No abnormality visualized.

Pancreas: Visualized portion unremarkable.

Spleen: Size and appearance within normal limits.

Right Kidney: Length: 11.5 cm, within normal limits. Echogenicity
within normal limits. No mass or hydronephrosis visualized.

Left Kidney: Length: 12.0 cm, within normal limits. Echogenicity
within normal limits. No mass or hydronephrosis visualized.

Abdominal aorta: No aneurysm visualized.

Other findings: None.
IMPRESSION: Negative abdominal ultrasound. No acute or focal abnormality to
explain the patient's pain.

## 2019-01-23 ENCOUNTER — Other Ambulatory Visit: Payer: Self-pay | Admitting: Emergency Medicine

## 2019-01-23 DIAGNOSIS — Z20822 Contact with and (suspected) exposure to covid-19: Secondary | ICD-10-CM

## 2019-01-24 LAB — NOVEL CORONAVIRUS, NAA: SARS-CoV-2, NAA: NOT DETECTED

## 2019-03-16 ENCOUNTER — Other Ambulatory Visit: Payer: Self-pay | Admitting: Internal Medicine

## 2019-03-24 ENCOUNTER — Encounter: Payer: Self-pay | Admitting: Internal Medicine

## 2019-03-24 ENCOUNTER — Ambulatory Visit (INDEPENDENT_AMBULATORY_CARE_PROVIDER_SITE_OTHER): Payer: 59 | Admitting: Internal Medicine

## 2019-03-24 ENCOUNTER — Other Ambulatory Visit: Payer: Self-pay

## 2019-03-24 VITALS — BP 127/82 | HR 86 | Temp 97.0°F | Ht 66.5 in | Wt 153.0 lb

## 2019-03-24 DIAGNOSIS — E782 Mixed hyperlipidemia: Secondary | ICD-10-CM

## 2019-03-24 DIAGNOSIS — I2510 Atherosclerotic heart disease of native coronary artery without angina pectoris: Secondary | ICD-10-CM

## 2019-03-24 DIAGNOSIS — I10 Essential (primary) hypertension: Secondary | ICD-10-CM | POA: Diagnosis not present

## 2019-03-24 NOTE — Patient Instructions (Signed)
Medication Instructions:  Your physician recommends that you continue on your current medications as directed. Please refer to the Current Medication list given to you today.  *If you need a refill on your cardiac medications before your next appointment, please call your pharmacy*  Lab Work: NONE If you have labs (blood work) drawn today and your tests are completely normal, you will receive your results only by: . MyChart Message (if you have MyChart) OR . A paper copy in the mail If you have any lab test that is abnormal or we need to change your treatment, we will call you to review the results.  Testing/Procedures: NONE  Follow-Up: At [facility], you and your health needs are our priority.  As part of our continuing mission to provide you with exceptional heart care, we have created designated Provider Care Teams.  These Care Teams include your primary Cardiologist (physician) and Advanced Practice Providers (APPs -  Physician Assistants and Nurse Practitioners) who all work together to provide you with the care you need, when you need it.  Your next appointment:   12 month(s)  The format for your next appointment:   In Person  Provider:   You may see Dr. Hilty or one of the following Advanced Practice Providers on your designated Care Team:    Hao Meng, PA-C  Angela Duke, PA-C or   Krista Kroeger, PA-C   Other Instructions   

## 2019-03-24 NOTE — Progress Notes (Signed)
OFFICE NOTE  Chief Complaint:  Routine follow-up  Primary Care Physician: Deland Pretty, MD  HPI:  Phillip Maffei is a pleasant 53 year old gentleman with a history of non-ST elevation MI in 2010. He had a 95% stenosis of distal RCA and received 2 stents in that area which were drug eluting. I discontinued his Plavix last year, and he has done well since then. He is active. He stopped smoking a few years ago, and generally he has done well. Denied any shortness of breath, palpitations, presyncope or syncopal symptoms. Recently he has changed his eating habits, increased his exercise and lost about 12 pounds. He looks great in his reports his energy level is much better. His cholesterol has been running higher and is now on Zetia in addition to atorvastatin. His triglycerides have improved significantly. He's also had less problems with acid reflux.  Mr. Larkey returns today and is without complaints. He continues to be active and runs 45 times a week. He denies any chest pain or shortness of breath. Cholesterol is been well controlled. His blood pressure is at goal.  08/23/2015  I saw Mr. Dhawan back today and he is without complaints. He is active and runs several times a week. He denies any chest pain or shortness of breath. Cholesterol is at goal. Blood pressure is at goal.  08/24/2016  Mr. Bucklin returns today for follow-up. Overall seems to be doing well. He denies chest pain worsening shortness of breath. He continues to be active and exercises regularly. Cholesterol was well controlled however he was intolerant of statin medications and he said that he had significant side effects, however they were mostly neurocognitive including confusion. Based on this he was switched to Praluent. He's now been on that for 3 weeks and seems to be tolerating it.  03/19/2018  Mr. Grygiel is seen today for routine follow-up.  Is been over a year since I last saw him.  Symptomatically he is doing well.   Denies any chest pain or worsening shortness of breath.  He had labs recently from his PCP for follow-up of dyslipidemia on Praluent.  His total cholesterol was 144, triglycerides 102, HDL 70 and LDL 54.  He denies any chest pain or worsening shortness of breath.  EKG shows normal sinus rhythm.  He did note that his father had heart attack in his 72s and for Mr. Bonny also premature onset coronary disease.  He does have one daughter who is age is 54.  She has not had a screening cholesterol yet however I recommended it.  There may be a familial hyperlipidemia.  His untreated LDL cholesterol was in the 140s.  We could also consider genetic testing.  He would be more interested if his daughter screening cholesterol was high.  03/24/2019  Mr. Crean returns today for follow-up.  Overall he is doing well.  Nuys any chest pain or worsening shortness of breath.  He continues on Praluent without any side effects.  Blood pressure is well controlled.  His EKG is normal.  Recent lipid profile showed total cholesterol 145, triglycerides 36, HDL 82 and LDL 56.  PMHx:  Past Medical History:  Diagnosis Date  . CAD (coronary artery disease) 2010   a. 2010 NSTEMI/PCI: 95% stenosis of distal RCA, stent x2  . Diastolic dysfunction    a. 08/2008 Echo: EF 60-65%, grade 1 DD, triv AI.  Marland Kitchen Dyslipidemia   . Hypertension     Past Surgical History:  Procedure Laterality Date  .  CARDIAC CATHETERIZATION  09/22/2008   LAD free of disease, L Cfx free of disease, RCA is large/dominant with 90% distal RCA lesion (Dr. Mariel Sleet) - PTCA & stenting of mid RCA & distal RCA with 3.0x10mm Endeavor DES (Dr,. Evlyn Courier)  . NM MYOCAR PERF WALL MOTION  09/20/2008   no abnormalities or defects   . TRANSTHORACIC ECHOCARDIOGRAM  09/20/2008   EF 60-65%, mild LVH, grade 1 diastolic dysfunction; trivial aortic valve regurg    FAMHx:  Family History  Problem Relation Age of Onset  . Hyperlipidemia Mother   . Heart Problems Unknown      SOCHx:   reports that he quit smoking about 10 years ago. He has never used smokeless tobacco. He reports that he does not drink alcohol or use drugs.  ALLERGIES:  Allergies  Allergen Reactions  . Lisinopril Cough    ROS: A comprehensive review of systems was negative.  HOME MEDS: Current Outpatient Medications  Medication Sig Dispense Refill  . aspirin 81 MG tablet Take 81 mg by mouth daily.    Marland Kitchen atenolol (TENORMIN) 25 MG tablet TAKE 1&1/2 TABLETS (37.5 MG TOTAL) BY MOUTH DAILY. 135 tablet 0  . lansoprazole (PREVACID) 15 MG capsule Take 15 mg by mouth as needed.     . nitroGLYCERIN (NITROSTAT) 0.4 MG SL tablet Place 1 tablet (0.4 mg total) under the tongue every 5 (five) minutes as needed for chest pain. 25 tablet 3  . PRALUENT 150 MG/ML SOPN Inject 1 mL as directed every 14 (fourteen) days.     No current facility-administered medications for this visit.     LABS/IMAGING: No results found for this or any previous visit (from the past 48 hour(s)). No results found.  VITALS: BP 127/82   Pulse 86   Temp (!) 97 F (36.1 C)   Ht 5' 6.5" (1.689 m)   Wt 153 lb (69.4 kg)   SpO2 96%   BMI 24.32 kg/m   EXAM: General appearance: alert and no distress Neck: no carotid bruit and no JVD Lungs: clear to auscultation bilaterally Heart: regular rate and rhythm, S1, S2 normal, no murmur, click, rub or gallop Abdomen: soft, non-tender; bowel sounds normal; no masses,  no organomegaly Extremities: extremities normal, atraumatic, no cyanosis or edema Pulses: 2+ and symmetric Skin: Skin color, texture, turgor normal. No rashes or lesions Neurologic: Grossly normal Psych: Mood, affect normal  EKG: Normal sinus rhythm at 81 -personally reviewed   ASSESSMENT: 1. Coronary artery disease status post PCI of the RCA in 2010 2. Dyslipidemia - statin intolerant, on Praluent 3. Hypertension 4. Family history of premature CAD in father 5. Possible familial hyperlipidemia  PLAN: 1.    Mr. Khader has had significant reduction in his lipids, now at goal LDL less than 70.  He has had no further coronary events.  His blood pressure is well controlled.  He exercises regularly and is asymptomatic.  No changes to his medicines today.  Follow-up with me annually or sooner as necessary.  Chrystie Nose, MD, [facility], FACP  [place]  [facility]  Medical Director of the Advanced Lipid Disorders &  Cardiovascular Risk Reduction Clinic Diplomate of the American Board of Clinical Lipidology Attending Cardiologist  Direct Dial: [phone]  Fax: [phone]  Website:  [email or web address] 03/24/2019, 3:44 PM

## 2019-06-10 ENCOUNTER — Other Ambulatory Visit: Payer: Self-pay | Admitting: Internal Medicine

## 2020-03-11 ENCOUNTER — Other Ambulatory Visit: Payer: Self-pay | Admitting: Internal Medicine

## 2020-03-16 ENCOUNTER — Ambulatory Visit (INDEPENDENT_AMBULATORY_CARE_PROVIDER_SITE_OTHER): Payer: 59 | Admitting: Internal Medicine

## 2020-03-16 ENCOUNTER — Encounter: Payer: Self-pay | Admitting: Internal Medicine

## 2020-03-16 ENCOUNTER — Other Ambulatory Visit: Payer: Self-pay

## 2020-03-16 VITALS — BP 118/84 | HR 69 | Temp 97.9°F | Ht 66.0 in | Wt 159.4 lb

## 2020-03-16 DIAGNOSIS — I10 Essential (primary) hypertension: Secondary | ICD-10-CM | POA: Diagnosis not present

## 2020-03-16 DIAGNOSIS — I2510 Atherosclerotic heart disease of native coronary artery without angina pectoris: Secondary | ICD-10-CM

## 2020-03-16 DIAGNOSIS — E782 Mixed hyperlipidemia: Secondary | ICD-10-CM

## 2020-03-16 DIAGNOSIS — T466X5A Adverse effect of antihyperlipidemic and antiarteriosclerotic drugs, initial encounter: Secondary | ICD-10-CM

## 2020-03-16 DIAGNOSIS — M791 Myalgia, unspecified site: Secondary | ICD-10-CM | POA: Diagnosis not present

## 2020-03-16 NOTE — Progress Notes (Signed)
OFFICE NOTE  Chief Complaint:  Routine follow-up  Primary Care Physician: Merri Brunette, MD  HPI:  Jacob Larson is a pleasant 54 year old gentleman with a history of non-ST elevation MI in 2010. He had a 95% stenosis of distal RCA and received 2 stents in that area which were drug eluting. I discontinued his Plavix last year, and he has done well since then. He is active. He stopped smoking a few years ago, and generally he has done well. Denied any shortness of breath, palpitations, presyncope or syncopal symptoms. Recently he has changed his eating habits, increased his exercise and lost about 12 pounds. He looks great in his reports his energy level is much better. His cholesterol has been running higher and is now on Zetia in addition to atorvastatin. His triglycerides have improved significantly. He's also had less problems with acid reflux.  Jacob Larson returns today and is without complaints. He continues to be active and runs 45 times a week. He denies any chest pain or shortness of breath. Cholesterol is been well controlled. His blood pressure is at goal.  08/23/2015  I saw Jacob Larson back today and he is without complaints. He is active and runs several times a week. He denies any chest pain or shortness of breath. Cholesterol is at goal. Blood pressure is at goal.  08/24/2016  Jacob Larson returns today for follow-up. Overall seems to be doing well. He denies chest pain worsening shortness of breath. He continues to be active and exercises regularly. Cholesterol was well controlled however he was intolerant of statin medications and he said that he had significant side effects, however they were mostly neurocognitive including confusion. Based on this he was switched to Praluent. He's now been on that for 3 weeks and seems to be tolerating it.  03/19/2018  Jacob Larson is seen today for routine follow-up.  Is been over a year since I last saw him.  Symptomatically he is doing well.   Denies any chest pain or worsening shortness of breath.  He had labs recently from his PCP for follow-up of dyslipidemia on Praluent.  His total cholesterol was 144, triglycerides 102, HDL 70 and LDL 54.  He denies any chest pain or worsening shortness of breath.  EKG shows normal sinus rhythm.  He did note that his father had heart attack in his 61s and for Jacob Larson also premature onset coronary disease.  He does have one daughter who is age is 84.  She has not had a screening cholesterol yet however I recommended it.  There may be a familial hyperlipidemia.  His untreated LDL cholesterol was in the 140s.  We could also consider genetic testing.  He would be more interested if his daughter screening cholesterol was high.  03/24/2019  Jacob Larson returns today for follow-up.  Overall he is doing well.  He denies any chest pain or worsening shortness of breath.  He continues on Praluent without any side effects.  Blood pressure is well controlled.  His EKG is normal.  Recent lipid profile showed total cholesterol 145, triglycerides 36, HDL 82 and LDL 56.  03/16/2020  Jacob Larson is seen today in follow-up.  He is done well over the past year.  There is no chest pain or worsening shortness of breath.  His PCP is prescribed him Praluent which she continues on.  His lipids are well controlled noting a recent LDL cholesterol of 60.  Blood pressure is excellent today 118/84.  He exercises  regularly without any issues.  EKG shows normal sinus rhythm without ischemic changes.  PMHx:  Past Medical History:  Diagnosis Date  . CAD (coronary artery disease) 2010   a. 2010 NSTEMI/PCI: 95% stenosis of distal RCA, stent x2  . Diastolic dysfunction    a. 08/2008 Echo: EF 60-65%, grade 1 DD, triv AI.  Marland Kitchen Dyslipidemia   . Hypertension     Past Surgical History:  Procedure Laterality Date  . CARDIAC CATHETERIZATION  09/22/2008   LAD free of disease, L Cfx free of disease, RCA is large/dominant with 90% distal RCA  lesion (Dr. Mariel Sleet) - PTCA & stenting of mid RCA & distal RCA with 3.0x38mm Endeavor DES (Dr,. Evlyn Courier)  . NM MYOCAR PERF WALL MOTION  09/20/2008   no abnormalities or defects   . TRANSTHORACIC ECHOCARDIOGRAM  09/20/2008   EF 60-65%, mild LVH, grade 1 diastolic dysfunction; trivial aortic valve regurg    FAMHx:  Family History  Problem Relation Age of Onset  . Hyperlipidemia Mother   . Heart Problems Unknown     SOCHx:   reports that he quit smoking about 11 years ago. He has never used smokeless tobacco. He reports that he does not drink alcohol and does not use drugs.  ALLERGIES:  Allergies  Allergen Reactions  . Lisinopril Cough    ROS: A comprehensive review of systems was negative.  HOME MEDS: Current Outpatient Medications  Medication Sig Dispense Refill  . aspirin 81 MG tablet Take 81 mg by mouth daily.    Marland Kitchen atenolol (TENORMIN) 25 MG tablet TAKE 1&1/2 TABLETS (37.5 MG TOTAL) BY MOUTH DAILY. 135 tablet 2  . lansoprazole (PREVACID) 15 MG capsule Take 15 mg by mouth as needed.     . nitroGLYCERIN (NITROSTAT) 0.4 MG SL tablet Place 1 tablet (0.4 mg total) under the tongue every 5 (five) minutes as needed for chest pain. 25 tablet 3  . PRALUENT 150 MG/ML SOPN Inject 1 mL as directed every 14 (fourteen) days.     No current facility-administered medications for this visit.    LABS/IMAGING: No results found for this or any previous visit (from the past 48 hour(s)). No results found.  VITALS: BP 118/84   Pulse 69   Temp 97.9 F (36.6 C)   Ht 5\' 6"  (1.676 m)   Wt 159 lb 6.4 oz (72.3 kg)   SpO2 97%   BMI 25.73 kg/m   EXAM: General appearance: alert and no distress Neck: no carotid bruit and no JVD Lungs: clear to auscultation bilaterally Heart: regular rate and rhythm, S1, S2 normal, no murmur, click, rub or gallop Abdomen: soft, non-tender; bowel sounds normal; no masses,  no organomegaly Extremities: extremities normal, atraumatic, no cyanosis or  edema Pulses: 2+ and symmetric Skin: Skin color, texture, turgor normal. No rashes or lesions Neurologic: Grossly normal Psych: Mood, affect normal  EKG: Normal sinus rhythm at 69-personally reviewed  ASSESSMENT: 1. Coronary artery disease status post PCI of the RCA in 2010 2. Dyslipidemia - statin intolerant, on Praluent 3. Hypertension 4. Family history of premature CAD in father 5. Possible familial hyperlipidemia  PLAN: 1.   Jacob Larson continues to do very well on Praluent and has been statin intolerant in the past.  LDL remains well below 70.  Blood pressure is well controlled.  He has had no recurrent coronary disease after stenting the RCA in 2010.  He continues to be active and exercises regularly without issues.  Follow-up with me annually or sooner  as necessary.  Chrystie Nose, MD, [facility], FACP  [place]  [facility]  Medical Director of the Advanced Lipid Disorders &  Cardiovascular Risk Reduction Clinic Diplomate of the American Board of Clinical Lipidology Attending Cardiologist  Direct Dial: [phone]  Fax: [phone]  Website:  [email or web address] 03/16/2020, 11:32 AM

## 2020-03-16 NOTE — Patient Instructions (Signed)
Medication Instructions:  Continue current medications  *If you need a refill on your cardiac medications before your next appointment, please call your pharmacy*   Lab Work: None Ordered   Testing/Procedures: None ordered   Follow-Up: At [facility], you and your health needs are our priority.  As part of our continuing mission to provide you with exceptional heart care, we have created designated Provider Care Teams.  These Care Teams include your primary Cardiologist (physician) and Advanced Practice Providers (APPs -  Physician Assistants and Nurse Practitioners) who all work together to provide you with the care you need, when you need it.  We recommend signing up for the patient portal called "MyChart".  Sign up information is provided on this After Visit Summary.  MyChart is used to connect with patients for Virtual Visits (Telemedicine).  Patients are able to view lab/test results, encounter notes, upcoming appointments, etc.  Non-urgent messages can be sent to your provider as well.   To learn more about what you can do with MyChart, go to [email or web address].    Your next appointment:   1 year(s)  The format for your next appointment:   In Person  Provider:   You may see Zoila Shutter, MD or one of the following Advanced Practice Providers on your designated Care Team:    Azalee Course, PA-C  Micah Flesher, PA-C or   Judy Pimple, [place]

## 2020-11-21 ENCOUNTER — Other Ambulatory Visit: Payer: Self-pay | Admitting: Internal Medicine

## 2021-06-27 DIAGNOSIS — Z961 Presence of intraocular lens: Secondary | ICD-10-CM | POA: Diagnosis not present

## 2021-08-12 ENCOUNTER — Other Ambulatory Visit: Payer: Self-pay | Admitting: Internal Medicine

## 2021-08-28 ENCOUNTER — Other Ambulatory Visit: Payer: Self-pay | Admitting: Internal Medicine

## 2021-09-13 DIAGNOSIS — T466X5A Adverse effect of antihyperlipidemic and antiarteriosclerotic drugs, initial encounter: Secondary | ICD-10-CM | POA: Diagnosis not present

## 2021-09-13 DIAGNOSIS — Z23 Encounter for immunization: Secondary | ICD-10-CM | POA: Diagnosis not present

## 2021-09-13 DIAGNOSIS — E7800 Pure hypercholesterolemia, unspecified: Secondary | ICD-10-CM | POA: Diagnosis not present

## 2021-09-13 DIAGNOSIS — M791 Myalgia, unspecified site: Secondary | ICD-10-CM | POA: Diagnosis not present

## 2021-09-13 DIAGNOSIS — I252 Old myocardial infarction: Secondary | ICD-10-CM | POA: Diagnosis not present

## 2022-01-23 ENCOUNTER — Encounter: Payer: Self-pay | Admitting: Physician Assistant

## 2022-01-23 ENCOUNTER — Ambulatory Visit: Payer: BC Managed Care – PPO | Attending: Physician Assistant | Admitting: Physician Assistant

## 2022-01-23 VITALS — BP 140/76 | HR 66 | Ht 66.0 in | Wt 156.6 lb

## 2022-01-23 DIAGNOSIS — E785 Hyperlipidemia, unspecified: Secondary | ICD-10-CM | POA: Diagnosis not present

## 2022-01-23 DIAGNOSIS — R002 Palpitations: Secondary | ICD-10-CM | POA: Diagnosis not present

## 2022-01-23 DIAGNOSIS — I10 Essential (primary) hypertension: Secondary | ICD-10-CM

## 2022-01-23 DIAGNOSIS — I2510 Atherosclerotic heart disease of native coronary artery without angina pectoris: Secondary | ICD-10-CM | POA: Diagnosis not present

## 2022-01-23 NOTE — Patient Instructions (Addendum)
Medication Instructions:  Your physician recommends that you continue on your current medications as directed. Please refer to the Current Medication list given to you today.  *If you need a refill on your cardiac medications before your next appointment, please call your pharmacy*  Lab Work: NONE ordered at this time of appointment   If you have labs (blood work) drawn today and your tests are completely normal, you will receive your results only by: [place] (if you have MyChart) OR A paper copy in the mail If you have any lab test that is abnormal or we need to change your treatment, we will call you to review the results.  Testing/Procedures: NONE ordered at this time of appointment   Follow-Up: At [facility], you and your health needs are our priority.  As part of our continuing mission to provide you with exceptional heart care, we have created designated Provider Care Teams.  These Care Teams include your primary Cardiologist (physician) and Advanced Practice Providers (APPs -  Physician Assistants and Nurse Practitioners) who all work together to provide you with the care you need, when you need it.   Your next appointment:   1 year(s)  The format for your next appointment:   In Person  Provider:   Pixie Casino, MD     Other Instructions Please give our office a call if you are still experiencing palpitations and can not use Apple watch or [facility] device.   Important Information About Sugar

## 2022-01-23 NOTE — Progress Notes (Unsigned)
Cardiology Office Note:    Date:  01/25/2022   ID:  Jacob Larson, DOB 1965-06-24, MRN [id number]  PCP:  Merri Brunette, MD    [facility] Providers Cardiologist:  Chrystie Nose, MD     Referring MD: Merri Brunette, MD   Chief Complaint  Patient presents with   Follow-up    Seen for Dr. Rennis Golden    History of Present Illness:    Jacob Larson is a 56 y.o. male with a hx of CAD, HTN and HLD.  Patient had an NSTEMI in 2010 and found to have 95% stenosis in distal RCA which was treated with 2 drug-eluting stents.  He has significant family history of early CAD with his father having heart attack in his 30s.  He is on Praluent for cholesterol control.  Patient was last seen by Dr. Rennis Golden in November 2021 at which time he was doing well without chest pain or dyspnea.  Patient presents today for follow-up.  He denies any recent chest pain or shortness of breath.  In the past few weeks, he has been having increased pounding sensation in the chest.  He says during the pounding sensation, the highest his heart rate will ever go is in the 90s.  He denies a tachypalpitations sensation but mentions his heart rate is more noticeable.  He denies any chest pain or worsening dyspnea.  He has no problem exerting himself.  He was actually having pounding sensation in the room when I was talking to him, however underlying rhythm based on the EKG was normal sinus and his heart rate is some to be very regular.  I wonder if his symptoms could be anxiety induced.  We talked about heart monitor versus Apple Watch/cardia mobile device.  His daughter actually has a Scientist, physiological he can try.  And if he is unable to use the Apple Watch, and that his palpitation or pounding sensation continues or worsens, he has been instructed to contact us and we can order a heart monitor.  Otherwise, he can follow-up in 1 year.  I recommended cutting back on caffeinated drinks and alcohol.  He is PCP can check thyroid panel in  November  Past Medical History:  Diagnosis Date   CAD (coronary artery disease) 2010   a. 2010 NSTEMI/PCI: 95% stenosis of distal RCA, stent x2   Diastolic dysfunction    a. 08/2008 Echo: EF 60-65%, grade 1 DD, triv AI.   Dyslipidemia    Hypertension     Past Surgical History:  Procedure Laterality Date   CARDIAC CATHETERIZATION  09/22/2008   LAD free of disease, L Cfx free of disease, RCA is large/dominant with 90% distal RCA lesion (Dr. Mariel Sleet) - PTCA & stenting of mid RCA & distal RCA with 3.0x29mm Endeavor DES (Dr,. Evlyn Courier)   NM [facility] PERF WALL MOTION  09/20/2008   no abnormalities or defects    TRANSTHORACIC ECHOCARDIOGRAM  09/20/2008   EF 60-65%, mild LVH, grade 1 diastolic dysfunction; trivial aortic valve regurg    Current Medications: Current Meds  Medication Sig   aspirin 81 MG tablet Take 81 mg by mouth daily.   atenolol (TENORMIN) 25 MG tablet TAKE 1&1/2 TABLETS (37.5 MG TOTAL) BY MOUTH DAILY.   nitroGLYCERIN (NITROSTAT) 0.4 MG SL tablet Place 1 tablet (0.4 mg total) under the tongue every 5 (five) minutes as needed for chest pain.   PRALUENT 150 MG/ML SOPN Inject 1 mL as directed every 14 (fourteen) days.   [DISCONTINUED]  lansoprazole (PREVACID) 15 MG capsule Take 15 mg by mouth daily.     Allergies:   Lisinopril   Social History   Socioeconomic History   Marital status: Married    Spouse name: Not on file   Number of children: 1   Years of education: 16   Highest education level: Not on file  Occupational History   Not on file  Tobacco Use   Smoking status: Former    Types: Cigarettes    Quit date: 09/23/2008    Years since quitting: 13.3   Smokeless tobacco: Never  Substance and Sexual Activity   Alcohol use: No   Drug use: No   Sexual activity: Not on file  Other Topics Concern   Not on file  Social History Narrative   Not on file   Social Determinants of Health   Financial Resource Strain: Not on file  Food Insecurity: Not on file   Transportation Needs: Not on file  Physical Activity: Not on file  Stress: Not on file  Social Connections: Not on file     Family History: The patient's family history includes Heart Problems in his unknown relative; Hyperlipidemia in his mother.  ROS:   Please see the history of present illness.     All other systems reviewed and are negative.  EKGs/Labs/Other Studies Reviewed:    The following studies were reviewed today:  N/A  EKG:  EKG is ordered today.  The ekg ordered today demonstrates normal sinus rhythm, no significant ST-T wave changes.  Recent Labs: No results found for requested labs within last 365 days.  Recent Lipid Panel    Component Value Date/Time   CHOL (H) 09/20/2008 0422    278        ATP III CLASSIFICATION:  <200     mg/dL   Desirable  607-371  mg/dL   Borderline High  >=062    mg/dL   High          TRIG 694 (H) 09/20/2008 0422   HDL 48 09/20/2008 0422   CHOLHDL 5.8 09/20/2008 0422   VLDL 68 (H) 09/20/2008 0422   LDLCALC (H) 09/20/2008 0422    162        Total Cholesterol/HDL:CHD Risk Coronary Heart Disease Risk Table                     Men   Women  1/2 Average Risk   3.4   3.3  Average Risk       5.0   4.4  2 X Average Risk   9.6   7.1  3 X Average Risk  23.4   11.0        Use the calculated Patient Ratio above and the CHD Risk Table to determine the patient's CHD Risk.        ATP III CLASSIFICATION (LDL):  <100     mg/dL   Optimal  854-627  mg/dL   Near or Above                    Optimal  130-159  mg/dL   Borderline  035-009  mg/dL   High  >381     mg/dL   Very High     Risk Assessment/Calculations:           Physical Exam:    VS:  BP (!) 140/76   Pulse 66   Ht 5\' 6"  (1.676 m)   Wt 156 lb 9.6  oz (71 kg)   SpO2 98%   BMI 25.28 kg/m        Wt Readings from Last 3 Encounters:  01/23/22 156 lb 9.6 oz (71 kg)  03/16/20 159 lb 6.4 oz (72.3 kg)  03/24/19 153 lb (69.4 kg)     GEN:  Well nourished, well developed  in no acute distress HEENT: Normal NECK: No JVD; No carotid bruits LYMPHATICS: No lymphadenopathy CARDIAC: RRR, no murmurs, rubs, gallops RESPIRATORY:  Clear to auscultation without rales, wheezing or rhonchi  ABDOMEN: Soft, non-tender, non-distended MUSCULOSKELETAL:  No edema; No deformity  SKIN: Warm and dry NEUROLOGIC:  Alert and oriented x 3 PSYCHIATRIC:  Normal affect   ASSESSMENT:    1. Palpitations   2. Coronary artery disease involving native coronary artery of native heart without angina pectoris   3. Essential hypertension   4. Hyperlipidemia LDL goal <70    PLAN:    In order of problems listed above:  Palpitation: Patient has been having pounding sensation recently.  Even during today's visit, patient was still experiencing pounding sensation.  EKG shows sinus rhythm.  Some of his symptoms may be more related to anxiety.  I recommend a either a heart monitor versus Kardia mobile/Apple Watch.  His daughter actually has an Visual merchandiser he can try.  He will attempt to use his or his Apple Watch to obtain some strips, however if for any reason he is unable to use the Apple Watch, we can always order a ZIO XT monitor.  CAD: On aspirin, Praluent and atenolol  Hypertension: Blood pressure stable  Hyperlipidemia: On Praluent.           Medication Adjustments/Labs and Tests Ordered: Current medicines are reviewed at length with the patient today.  Concerns regarding medicines are outlined above.  Orders Placed This Encounter  Procedures   EKG 12-Lead   No orders of the defined types were placed in this encounter.   Patient Instructions  Medication Instructions:  Your physician recommends that you continue on your current medications as directed. Please refer to the Current Medication list given to you today.  *If you need a refill on your cardiac medications before your next appointment, please call your pharmacy*  Lab Work: NONE ordered at this time of  appointment   If you have labs (blood work) drawn today and your tests are completely normal, you will receive your results only by: [place] (if you have MyChart) OR A paper copy in the mail If you have any lab test that is abnormal or we need to change your treatment, we will call you to review the results.  Testing/Procedures: NONE ordered at this time of appointment   Follow-Up: At [facility], you and your health needs are our priority.  As part of our continuing mission to provide you with exceptional heart care, we have created designated Provider Care Teams.  These Care Teams include your primary Cardiologist (physician) and Advanced Practice Providers (APPs -  Physician Assistants and Nurse Practitioners) who all work together to provide you with the care you need, when you need it.   Your next appointment:   1 year(s)  The format for your next appointment:   In Person  Provider:   Pixie Casino, MD     Other Instructions Please give our office a call if you are still experiencing palpitations and can not use Apple watch or [facility] device.   Important Information About Sugar  Ramond Dial, [place]  01/25/2022 10:22 PM    [facility]

## 2022-01-25 ENCOUNTER — Encounter: Payer: Self-pay | Admitting: Physician Assistant

## 2022-03-20 DIAGNOSIS — Z125 Encounter for screening for malignant neoplasm of prostate: Secondary | ICD-10-CM | POA: Diagnosis not present

## 2022-03-20 DIAGNOSIS — Z0000 Encounter for general adult medical examination without abnormal findings: Secondary | ICD-10-CM | POA: Diagnosis not present

## 2022-04-05 DIAGNOSIS — I10 Essential (primary) hypertension: Secondary | ICD-10-CM | POA: Diagnosis not present

## 2022-04-05 DIAGNOSIS — I252 Old myocardial infarction: Secondary | ICD-10-CM | POA: Diagnosis not present

## 2022-04-05 DIAGNOSIS — I2510 Atherosclerotic heart disease of native coronary artery without angina pectoris: Secondary | ICD-10-CM | POA: Diagnosis not present

## 2022-04-05 DIAGNOSIS — Z23 Encounter for immunization: Secondary | ICD-10-CM | POA: Diagnosis not present

## 2022-04-05 DIAGNOSIS — K219 Gastro-esophageal reflux disease without esophagitis: Secondary | ICD-10-CM | POA: Diagnosis not present

## 2022-07-10 DIAGNOSIS — R053 Chronic cough: Secondary | ICD-10-CM | POA: Diagnosis not present

## 2022-08-31 ENCOUNTER — Encounter: Payer: Self-pay | Admitting: Pulmonary Disease

## 2022-08-31 ENCOUNTER — Ambulatory Visit (INDEPENDENT_AMBULATORY_CARE_PROVIDER_SITE_OTHER): Payer: BC Managed Care – PPO | Admitting: Pulmonary Disease

## 2022-08-31 VITALS — BP 122/70 | HR 75 | Ht 66.0 in | Wt 154.0 lb

## 2022-08-31 DIAGNOSIS — R053 Chronic cough: Secondary | ICD-10-CM | POA: Diagnosis not present

## 2022-08-31 DIAGNOSIS — Z87891 Personal history of nicotine dependence: Secondary | ICD-10-CM | POA: Diagnosis not present

## 2022-08-31 NOTE — Progress Notes (Signed)
Synopsis: Referred in May 2024 for chronic cough by Merri Brunette, MD  Subjective:   PATIENT ID: Jacob Larson GENDER: male DOB: 1965/12/09, MRN: [id number]   HPI  Chief Complaint  Patient presents with   Consult    Referred by PCP for chronic cough for the past 4 months.    Jacob Larson is a 57 year old man, former smoker with CAD, hypertension and hyperlidpidema who is referred to pulmonary clinic for chronic cough.   Patient reports cough that started after respiratory infection in mid December. The cough nearly resolved a month ago but then it returned. The cough is worse in the mornings and is non-productive. He has nasal congestion with post-nasal drip. He has history of GERD and is on pantoprazole but denies worsening symptoms at this time. He denies wheezing or dyspnea. He is working out rigorously throughout the week. He has no night time awakenings.   He will use nasocort nasal spray as needed for allergies.   He is a former smoker. He quit in 2010. He was smoking 1.5 packs per day, for 25 years.  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 No second hand smoke currently. No family history of lung disease.   Past Medical History:  Diagnosis Date   CAD (coronary artery disease) 2010   a. 2010 NSTEMI/PCI: 95% stenosis of distal RCA, stent x2   Diastolic dysfunction    a. 08/2008 Echo: EF 60-65%, grade 1 DD, triv AI.   Dyslipidemia    Hypertension      Family History  Problem Relation Age of Onset   Hyperlipidemia Mother    Heart Problems Unknown      Social History   Socioeconomic History   Marital status: Married    Spouse name: Not on file   Number of children: 1   Years of education: 16   Highest education level: Not on file  Occupational History   Not on file  Tobacco Use   Smoking status: Former    Types: Cigarettes    Quit date: 09/23/2008    Years since quitting: 13.9   Smokeless tobacco: Never  Substance and Sexual Activity   Alcohol use: No   Drug use:  No   Sexual activity: Not on file  Other Topics Concern   Not on file  Social History Narrative   Not on file   Social Determinants of Health   Financial Resource Strain: Not on file  Food Insecurity: Not on file  Transportation Needs: Not on file  Physical Activity: Not on file  Stress: Not on file  Social Connections: Not on file  Intimate Partner Violence: Not on file     Allergies  Allergen Reactions   Lisinopril Cough     Outpatient Medications Prior to Visit  Medication Sig Dispense Refill   aspirin 81 MG tablet Take 81 mg by mouth daily.     atenolol (TENORMIN) 25 MG tablet TAKE 1&1/2 TABLETS (37.5 MG TOTAL) BY MOUTH DAILY. 135 tablet 2   nitroGLYCERIN (NITROSTAT) 0.4 MG SL tablet Place 1 tablet (0.4 mg total) under the tongue every 5 (five) minutes as needed for chest pain. 25 tablet 3   pantoprazole (PROTONIX) 40 MG tablet Take 40 mg by mouth daily.     PRALUENT 150 MG/ML SOPN Inject 1 mL as directed every 14 (fourteen) days.     No facility-administered medications prior to visit.   Review of Systems  Constitutional:  Negative for chills, fever, malaise/fatigue and  weight loss.  HENT:  Positive for congestion. Negative for sinus pain and sore throat.   Eyes: Negative.   Respiratory:  Positive for cough. Negative for hemoptysis, sputum production, shortness of breath and wheezing.   Cardiovascular:  Negative for chest pain, palpitations, orthopnea, claudication and leg swelling.  Gastrointestinal:  Negative for abdominal pain, heartburn, nausea and vomiting.  Genitourinary: Negative.   Musculoskeletal:  Negative for joint pain and myalgias.  Skin:  Negative for rash.  Neurological:  Negative for weakness.  Endo/Heme/Allergies: Negative.   Psychiatric/Behavioral: Negative.     Objective:   Vitals:   08/31/22 1334  BP: 122/70  Pulse: 75  SpO2: 99%  Weight: 154 lb (69.9 kg)  Height: 5\' 6"  (1.676 m)     Physical Exam Constitutional:      General: He  is not in acute distress. HENT:     Head: Normocephalic and atraumatic.     Nose: Rhinorrhea present.  Eyes:     Conjunctiva/sclera: Conjunctivae normal.  Cardiovascular:     Rate and Rhythm: Normal rate and regular rhythm.     Pulses: Normal pulses.     Heart sounds: Normal heart sounds. No murmur heard. Pulmonary:     Effort: Pulmonary effort is normal.     Breath sounds: Normal breath sounds.  Skin:    General: Skin is warm and dry.  Neurological:     General: No focal deficit present.     Mental Status: He is alert.    CBC    Component Value Date/Time   WBC 11.3 (H) 05/17/2016 1503   RBC 4.65 05/17/2016 1503   HGB 14.5 05/17/2016 1503   HCT 42.9 05/17/2016 1503   PLT 185 05/17/2016 1503   MCV 92.3 05/17/2016 1503   MCH 31.2 05/17/2016 1503   MCHC 33.8 05/17/2016 1503   RDW 13.7 05/17/2016 1503   LYMPHSABS 2.8 09/20/2008 0422   MONOABS 1.0 09/20/2008 0422   EOSABS 0.1 09/20/2008 0422   BASOSABS 0.1 09/20/2008 0422      Latest Ref Rng & Units 05/17/2016    3:03 PM 07/20/2013    4:25 PM 09/24/2008    4:36 AM  BMP  Glucose 65 - 99 mg/dL 161  096  045   BUN 7 - 25 mg/dL 9  6  12    Creatinine 0.60 - 1.35 mg/dL 4.09  8.11  9.14   Sodium 135 - 146 mmol/L 142  139  143   Potassium 3.5 - 5.3 mmol/L 4.1  4.1  3.5   Chloride 98 - 110 mmol/L 103  99  107   CO2 20 - 31 mmol/L 29   26   Calcium 8.6 - 10.3 mg/dL 9.7   9.5    Chest imaging: CXR 07/2022 Reviewed images, no acute cardiopulmonary disease  PFT:     No data to display          Labs:  Path:  Echo:  Heart Catheterization:  Assessment & Plan:   Former smoker - Plan: Ambulatory Referral for Lung Cancer Scre  Chronic cough  Discussion: Jacob Larson is a 57 year old man, former smoker with CAD, hypertension and hyperlidpidema who is referred to pulmonary clinic for chronic cough.   He appears to have post-nasal drip that is leading to his cough. He is to start zyrtec or allegra daily along with  nasocort nasal spray daily. He is to reach out if symptoms not improved in 1 month.   His chest imaging is reassuring at  this time. He does have a 20+ pack year history and quit in 2010. We will refer him to our lung cancer screening program.   Follow up in 2-3 months.   Melody Comas, MD [facility] Office: [phone]    Current Outpatient Medications:    aspirin 81 MG tablet, Take 81 mg by mouth daily., Disp: , Rfl:    atenolol (TENORMIN) 25 MG tablet, TAKE 1&1/2 TABLETS (37.5 MG TOTAL) BY MOUTH DAILY., Disp: 135 tablet, Rfl: 2   nitroGLYCERIN (NITROSTAT) 0.4 MG SL tablet, Place 1 tablet (0.4 mg total) under the tongue every 5 (five) minutes as needed for chest pain., Disp: 25 tablet, Rfl: 3   pantoprazole (PROTONIX) 40 MG tablet, Take 40 mg by mouth daily., Disp: , Rfl:    PRALUENT 150 MG/ML SOPN, Inject 1 mL as directed every 14 (fourteen) days., Disp: , Rfl:

## 2022-08-31 NOTE — Patient Instructions (Addendum)
Your cough appears to be from post-nasal drip  Use nasocort nasal spray daily for 2-4 weeks  Start zyrtec or allegra daily   If symptoms are not improved in 4 weeks, let us know and we can try a second nasal spray.  We will refer you to our lung cancer screening program for CT chest screening.   Follow up in 2-3 months

## 2022-09-26 DIAGNOSIS — Z961 Presence of intraocular lens: Secondary | ICD-10-CM | POA: Diagnosis not present

## 2022-11-17 ENCOUNTER — Ambulatory Visit: Payer: BC Managed Care – PPO | Admitting: Pulmonary Disease

## 2022-11-17 ENCOUNTER — Encounter: Payer: Self-pay | Admitting: Pulmonary Disease

## 2022-11-17 VITALS — BP 118/70 | HR 60 | Ht 66.0 in | Wt 163.2 lb

## 2022-11-17 DIAGNOSIS — R053 Chronic cough: Secondary | ICD-10-CM

## 2022-11-17 NOTE — Progress Notes (Unsigned)
Synopsis: Referred in May 2024 for chronic cough by Merri Brunette, MD  Subjective:   PATIENT ID: Jacob Larson GENDER: male DOB: [birth year], MRN: [id number]  HPI  Chief Complaint  Patient presents with   Follow-up    F/U on cough. States his cough is completely gone.    Jacob Larson is a 57 year old man, former smoker with CAD, hypertension and hyperlidpidema who returns to pulmonary clinic for chronic cough.   Initial OV 08/31/22 Patient reports cough that started after respiratory infection in mid December. The cough nearly resolved a month ago but then it returned. The cough is worse in the mornings and is non-productive. He has nasal congestion with post-nasal drip. He has history of GERD and is on pantoprazole but denies worsening symptoms at this time. He denies wheezing or dyspnea. He is working out rigorously throughout the week. He has no night time awakenings.   He will use nasocort nasal spray as needed for allergies.   He is a former smoker. He quit in 2010. He was smoking 1.5 packs per day, for 25 years. 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 No second hand smoke currently. No family history of lung disease.  Today 11/17/22 His cough has resolved with zyrtec and nasocort after about 2 to 3 weeks of treatment.  He has since discontinued these therapies and has noticed a slight return of his cough and postnasal drainage.  Otherwise he has been doing well.  Past Medical History:  Diagnosis Date   CAD (coronary artery disease) 2010   a. 2010 NSTEMI/PCI: 95% stenosis of distal RCA, stent x2   Diastolic dysfunction    a. 08/2008 Echo: EF 60-65%, grade 1 DD, triv AI.   Dyslipidemia    Hypertension      Family History  Problem Relation Age of Onset   Hyperlipidemia Mother    Heart Problems Unknown      Social History   Socioeconomic History   Marital status: Married    Spouse name: Not on file   Number of children: 1   Years of education: 16   Highest education level: Not  on file  Occupational History   Not on file  Tobacco Use   Smoking status: Former    Current packs/day: 0.00    Types: Cigarettes    Quit date: 09/23/2008    Years since quitting: 14.1   Smokeless tobacco: Never  Substance and Sexual Activity   Alcohol use: No   Drug use: No   Sexual activity: Not on file  Other Topics Concern   Not on file  Social History Narrative   Not on file   Social Determinants of Health   Financial Resource Strain: Not on file  Food Insecurity: Not on file  Transportation Needs: Not on file  Physical Activity: Not on file  Stress: Not on file  Social Connections: Not on file  Intimate Partner Violence: Not on file     Allergies  Allergen Reactions   Lisinopril Cough     Outpatient Medications Prior to Visit  Medication Sig Dispense Refill   aspirin 81 MG tablet Take 81 mg by mouth daily.     atenolol (TENORMIN) 25 MG tablet TAKE 1&1/2 TABLETS (37.5 MG TOTAL) BY MOUTH DAILY. 135 tablet 2   nitroGLYCERIN (NITROSTAT) 0.4 MG SL tablet Place 1 tablet (0.4 mg total) under the tongue every 5 (five) minutes as needed for chest pain. 25 tablet 3   pantoprazole (PROTONIX) 40 MG  tablet Take 40 mg by mouth daily.     PRALUENT 150 MG/ML SOPN Inject 1 mL as directed every 14 (fourteen) days.     No facility-administered medications prior to visit.   Review of Systems  Constitutional:  Negative for chills, fever, malaise/fatigue and weight loss.  HENT:  Positive for congestion. Negative for sinus pain and sore throat.   Eyes: Negative.   Respiratory:  Positive for cough. Negative for hemoptysis, sputum production, shortness of breath and wheezing.   Cardiovascular:  Negative for chest pain, palpitations, orthopnea, claudication and leg swelling.  Gastrointestinal:  Negative for abdominal pain, heartburn, nausea and vomiting.  Genitourinary: Negative.   Musculoskeletal:  Negative for joint pain and myalgias.  Skin:  Negative for rash.  Neurological:   Negative for weakness.  Endo/Heme/Allergies: Negative.   Psychiatric/Behavioral: Negative.     Objective:   Vitals:   11/17/22 1341  BP: 118/70  Pulse: 60  SpO2: 98%  Weight: 163 lb 3.2 oz (74 kg)  Height: 5\' 6"  (1.676 m)      Physical Exam Constitutional:      General: He is not in acute distress. HENT:     Head: Normocephalic and atraumatic.  Eyes:     Conjunctiva/sclera: Conjunctivae normal.  Cardiovascular:     Rate and Rhythm: Normal rate and regular rhythm.     Pulses: Normal pulses.     Heart sounds: Normal heart sounds. No murmur heard. Pulmonary:     Effort: Pulmonary effort is normal.     Breath sounds: Normal breath sounds.  Skin:    General: Skin is warm and dry.  Neurological:     General: No focal deficit present.     Mental Status: He is alert.    CBC    Component Value Date/Time   WBC 11.3 (H) 05/17/2016 1503   RBC 4.65 05/17/2016 1503   HGB 14.5 05/17/2016 1503   HCT 42.9 05/17/2016 1503   PLT 185 05/17/2016 1503   MCV 92.3 05/17/2016 1503   MCH 31.2 05/17/2016 1503   MCHC 33.8 05/17/2016 1503   RDW 13.7 05/17/2016 1503   LYMPHSABS 2.8 09/20/2008 0422   MONOABS 1.0 09/20/2008 0422   EOSABS 0.1 09/20/2008 0422   BASOSABS 0.1 09/20/2008 0422      Latest Ref Rng & Units 05/17/2016    3:03 PM 07/20/2013    4:25 PM 09/24/2008    4:36 AM  BMP  Glucose 65 - 99 mg/dL 161  096  045   BUN 7 - 25 mg/dL 9  6  12    Creatinine 0.60 - 1.35 mg/dL 4.09  8.11  9.14   Sodium 135 - 146 mmol/L 142  139  143   Potassium 3.5 - 5.3 mmol/L 4.1  4.1  3.5   Chloride 98 - 110 mmol/L 103  99  107   CO2 20 - 31 mmol/L 29   26   Calcium 8.6 - 10.3 mg/dL 9.7   9.5    Chest imaging: CXR 07/2022 Reviewed images, no acute cardiopulmonary disease  PFT:     No data to display          Labs:  Path:  Echo:  Heart Catheterization:  Assessment & Plan:   Chronic cough  Discussion: Jacob Larson is a 57 year old man, former smoker with CAD, hypertension  and hyperlidpidema who returns to pulmonary clinic for chronic cough.   He appears to have post-nasal drip that is leading to his cough which has  resolved with Zyrtec and daily Nasacort.  The cough has started to return since discontinuation of his treatment.  Recommend that he continue Zyrtec and Nasacort for total of 3 months before a trial of coming off the medications.  He is now interested after much discussion today and following through with lung cancer screening.  We discussed he would be eligible for screening this year and next year and after that he does not qualify for further scanning as he will have quit smoking 15 years ago next year.  Message for lung cancer screening to arrange screening CT chest scan.  To follow-up as needed.  Melody Comas, MD [facility] Office: [phone]    Current Outpatient Medications:    aspirin 81 MG tablet, Take 81 mg by mouth daily., Disp: , Rfl:    atenolol (TENORMIN) 25 MG tablet, TAKE 1&1/2 TABLETS (37.5 MG TOTAL) BY MOUTH DAILY., Disp: 135 tablet, Rfl: 2   nitroGLYCERIN (NITROSTAT) 0.4 MG SL tablet, Place 1 tablet (0.4 mg total) under the tongue every 5 (five) minutes as needed for chest pain., Disp: 25 tablet, Rfl: 3   pantoprazole (PROTONIX) 40 MG tablet, Take 40 mg by mouth daily., Disp: , Rfl:    PRALUENT 150 MG/ML SOPN, Inject 1 mL as directed every 14 (fourteen) days., Disp: , Rfl:

## 2022-11-17 NOTE — Patient Instructions (Addendum)
Use nasocort nasal spray and Zyrtec daily for 3 months and then monitor for return of cough symptoms  If returns we can consider referral to an ENT team.   I will message our screening team to call you in regards to getting a CT Chest schedule for lung cancer screening.   Follow up as needed, please call us if you need referral to ENT

## 2022-11-19 ENCOUNTER — Encounter: Payer: Self-pay | Admitting: Pulmonary Disease

## 2022-11-23 ENCOUNTER — Other Ambulatory Visit: Payer: Self-pay | Admitting: *Deleted

## 2022-11-23 DIAGNOSIS — Z87891 Personal history of nicotine dependence: Secondary | ICD-10-CM

## 2022-11-23 DIAGNOSIS — Z122 Encounter for screening for malignant neoplasm of respiratory organs: Secondary | ICD-10-CM

## 2022-12-26 ENCOUNTER — Encounter: Payer: Self-pay | Admitting: Acute Care

## 2022-12-26 ENCOUNTER — Ambulatory Visit (INDEPENDENT_AMBULATORY_CARE_PROVIDER_SITE_OTHER): Payer: BC Managed Care – PPO | Admitting: Acute Care

## 2022-12-26 DIAGNOSIS — Z87891 Personal history of nicotine dependence: Secondary | ICD-10-CM

## 2022-12-26 NOTE — Progress Notes (Signed)
Virtual Visit via Telephone Note  I connected with Jacob Larson on 12/26/22 at  3:30 PM EDT by telephone and verified that I am speaking with the correct person using two identifiers.  Location: Patient:  At home Provider: [address] [place], [place], [address]    I discussed the limitations, risks, security and privacy concerns of performing an evaluation and management service by telephone and the availability of in person appointments. I also discussed with the patient that there may be a patient responsible charge related to this service. The patient expressed understanding and agreed to proceed.   Shared Decision Making Visit Lung Cancer Screening Program [phone])   Eligibility: Age 57 y.o. Pack Years Smoking History Calculation 36 pack year smoking history (# packs/per year x # years smoked) Recent History of coughing up blood  no Unexplained weight loss? no ( >Than 15 pounds within the last 6 months ) Prior History Lung / other cancer no (Diagnosis within the last 5 years already requiring surveillance chest CT Scans). Smoking Status Former Smoker Former Smokers: Years since quit: 14 years  Quit Date: 09/23/2008  Visit Components: Discussion included one or more decision making aids. yes Discussion included risk/benefits of screening. yes Discussion included potential follow up diagnostic testing for abnormal scans. yes Discussion included meaning and risk of over diagnosis. yes Discussion included meaning and risk of False Positives. yes Discussion included meaning of total radiation exposure. yes  Counseling Included: Importance of adherence to annual lung cancer LDCT screening. yes Impact of comorbidities on ability to participate in the program. yes Ability and willingness to under diagnostic treatment. yes  Smoking Cessation Counseling: Current Smokers:  Discussed importance of smoking cessation. yes Information about tobacco cessation classes and  interventions provided to patient. yes Patient provided with "ticket" for LDCT Scan. yes Symptomatic Patient. no  Counseling NA Diagnosis Code: Tobacco Use Z72.0 Asymptomatic Patient yes  Counseling (Intermediate counseling: > three minutes counseling) O1308 Former Smokers:  Discussed the importance of maintaining cigarette abstinence. yes Diagnosis Code: Personal History of Nicotine Dependence. M57.846 Information about tobacco cessation classes and interventions provided to patient. Yes Patient provided with "ticket" for LDCT Scan. yes Written Order for Lung Cancer Screening with LDCT placed in Epic. Yes (CT Chest Lung Cancer Screening Low Dose W/O CM) [id number] Z12.2-Screening of respiratory organs Z87.891-Personal history of nicotine dependence  I spent 25 minutes of face to face time/virtual visit time  with  Ms. Wagy discussing the risks and benefits of lung cancer screening. We took the time to pause the power point at intervals to allow for questions to be asked and answered to ensure understanding. We discussed that he had taken the single most powerful action possible to decrease his risk of developing lung cancer when he quit smoking. I counseled him to remain smoke free, and to contact me if he ever had the desire to smoke again so that I can provide resources and tools to help support the effort to remain smoke free. We discussed the time and location of the scan, and that either  Abigail Miyamoto RN, Karlton Lemon, RN or I  or I will call / send a letter with the results within  24-72 hours of receiving them. He has the office contact information in the event he needs to speak with me,  he verbalized understanding of all of the above and had no further questions upon leaving the office.     I explained to the patient that there has been  a high incidence of coronary artery disease noted on these exams. I explained that this is a non-gated exam therefore degree or severity cannot be  determined. This patient is not on statin therapy. I have asked the patient to follow-up with their PCP regarding any incidental finding of coronary artery disease and management with diet or medication as they feel is clinically indicated. The patient verbalized understanding of the above and had no further questions.   Bevelyn Ngo, NP 12/26/2022

## 2022-12-26 NOTE — Patient Instructions (Signed)

## 2022-12-28 ENCOUNTER — Ambulatory Visit
Admission: RE | Admit: 2022-12-28 | Discharge: 2022-12-28 | Disposition: A | Discharge status: Home / Self Care | Payer: BC Managed Care – PPO | Source: Ambulatory Visit | Attending: Acute Care | Admitting: Acute Care

## 2022-12-28 DIAGNOSIS — Z87891 Personal history of nicotine dependence: Secondary | ICD-10-CM | POA: Diagnosis not present

## 2022-12-28 DIAGNOSIS — Z122 Encounter for screening for malignant neoplasm of respiratory organs: Secondary | ICD-10-CM

## 2023-01-03 ENCOUNTER — Other Ambulatory Visit: Payer: Self-pay

## 2023-01-03 DIAGNOSIS — Z122 Encounter for screening for malignant neoplasm of respiratory organs: Secondary | ICD-10-CM

## 2023-01-03 DIAGNOSIS — Z87891 Personal history of nicotine dependence: Secondary | ICD-10-CM

## 2023-02-13 ENCOUNTER — Ambulatory Visit: Payer: BC Managed Care – PPO | Attending: Physician Assistant | Admitting: Physician Assistant

## 2023-02-13 ENCOUNTER — Encounter: Payer: Self-pay | Admitting: Physician Assistant

## 2023-02-13 VITALS — BP 142/80 | HR 67 | Ht 67.0 in | Wt 157.4 lb

## 2023-02-13 DIAGNOSIS — I10 Essential (primary) hypertension: Secondary | ICD-10-CM | POA: Diagnosis not present

## 2023-02-13 DIAGNOSIS — E785 Hyperlipidemia, unspecified: Secondary | ICD-10-CM | POA: Diagnosis not present

## 2023-02-13 DIAGNOSIS — I2510 Atherosclerotic heart disease of native coronary artery without angina pectoris: Secondary | ICD-10-CM | POA: Diagnosis not present

## 2023-02-13 MED ORDER — NITROGLYCERIN 0.4 MG SL SUBL: 0.4000 mg | SUBLINGUAL_TABLET | SUBLINGUAL | 3 refills | Status: AC | PRN

## 2023-02-13 NOTE — Patient Instructions (Signed)
Medication Instructions:  NO CHANGES *If you need a refill on your cardiac medications before your next appointment, please call your pharmacy*   Lab Work: NO LABS If you have labs (blood work) drawn today and your tests are completely normal, you will receive your results only by: MyChart Message (if you have MyChart) OR A paper copy in the mail If you have any lab test that is abnormal or we need to change your treatment, we will call you to review the results.   Testing/Procedures: NO TESTING   Follow-Up: At [facility], you and your health needs are our priority.  As part of our continuing mission to provide you with exceptional heart care, we have created designated Provider Care Teams.  These Care Teams include your primary Cardiologist (physician) and Advanced Practice Providers (APPs -  Physician Assistants and Nurse Practitioners) who all work together to provide you with the care you need, when you need it.   Your next appointment:   1 year(s)  Provider:   Chrystie Nose, MD

## 2023-02-13 NOTE — Progress Notes (Unsigned)
  Cardiology Office Note:  .   Date:  02/13/2023  ID:  Jacob Larson, DOB [birth year], MRN [id number] PCP: Merri Brunette, MD  [facility] Providers Cardiologist:  Chrystie Nose, MD { Click to update primary MD,subspecialty MD or APP then REFRESH:1}   History of Present Illness: .   Jacob Larson is a 57 y.o. male with a hx of CAD, HTN and HLD.  Patient had an NSTEMI in 2010 and found to have 95% stenosis in distal RCA which was treated with 2 drug-eluting stents.  He has significant family history of early CAD with his father having heart attack in his 30s.  He is on Praluent for cholesterol control.  I last saw the patient in October 2023 at which time he was doing well.  He did complain of increased pounding sensation at the time.  EKG showed normal sinus rhythm.  We discussed Apple Watch, Kardia mobile device and a heart monitor at the time.  He wished to continue to monitor his symptoms with using her Apple Watch.  Patient presents today for follow-up.  He has not had any further pounding sensation in the chest.  He denies any exertional chest pain or shortness of breath.  He is very active and often runs 2 to 3 miles on a daily basis.  He denies any exertional symptoms.  He has no lower extremity orthopnea or PND.  He had a prolonged cough from November of last year until March of this year.  The cough was related to postnasal drainage.  It eventually resolved by itself.  He denied any leg swelling, orthopnea or PND even during the prolonged cough.  Given the fact he has great functional ability without any limitation, we will hold off on additional workup.  His EKG shows no significant change.  ROS: ***  Studies Reviewed: .        *** Risk Assessment/Calculations:   {Does this patient have ATRIAL FIBRILLATION?[phone]} The patient's 1st BP is elevated (>139/89)*** Repeat BP and {Click to enter a 2nd BP Refresh Note  :1}       Physical Exam:   VS:  BP (!) 142/80 (BP Location:  Left Arm, Patient Position: Sitting, Cuff Size: Normal)   Pulse 67   Ht 5\' 7"  (1.702 m)   Wt 157 lb 6.4 oz (71.4 kg)   SpO2 96%   BMI 24.65 kg/m    Wt Readings from Last 3 Encounters:  02/13/23 157 lb 6.4 oz (71.4 kg)  11/17/22 163 lb 3.2 oz (74 kg)  08/31/22 154 lb (69.9 kg)    GEN: Well nourished, well developed in no acute distress NECK: No JVD; No carotid bruits CARDIAC: ***RRR, no murmurs, rubs, gallops RESPIRATORY:  Clear to auscultation without rales, wheezing or rhonchi  ABDOMEN: Soft, non-tender, non-distended EXTREMITIES:  No edema; No deformity   ASSESSMENT AND PLAN: .   ***    {Are you ordering a CV Procedure (e.g. stress test, cath, DCCV, TEE, etc)?   Press F2        :[id number]}  Dispo: ***  Signed, Azalee Course, PA

## 2023-04-05 DIAGNOSIS — Z125 Encounter for screening for malignant neoplasm of prostate: Secondary | ICD-10-CM | POA: Diagnosis not present

## 2023-04-05 DIAGNOSIS — Z0000 Encounter for general adult medical examination without abnormal findings: Secondary | ICD-10-CM | POA: Diagnosis not present

## 2023-04-10 DIAGNOSIS — Z23 Encounter for immunization: Secondary | ICD-10-CM | POA: Diagnosis not present

## 2023-04-10 DIAGNOSIS — Z0000 Encounter for general adult medical examination without abnormal findings: Secondary | ICD-10-CM | POA: Diagnosis not present

## 2023-04-10 DIAGNOSIS — L570 Actinic keratosis: Secondary | ICD-10-CM | POA: Diagnosis not present

## 2023-08-23 DIAGNOSIS — G8929 Other chronic pain: Secondary | ICD-10-CM | POA: Diagnosis not present

## 2023-08-23 DIAGNOSIS — M5441 Lumbago with sciatica, right side: Secondary | ICD-10-CM | POA: Diagnosis not present

## 2024-03-11 DIAGNOSIS — M10072 Idiopathic gout, left ankle and foot: Secondary | ICD-10-CM | POA: Diagnosis not present

## 2024-03-18 DIAGNOSIS — M109 Gout, unspecified: Secondary | ICD-10-CM | POA: Diagnosis not present

## 2024-05-08 ENCOUNTER — Encounter: Payer: Self-pay | Admitting: Acute Care

## 2024-05-08 DIAGNOSIS — Z87891 Personal history of nicotine dependence: Secondary | ICD-10-CM

## 2024-05-08 DIAGNOSIS — Z122 Encounter for screening for malignant neoplasm of respiratory organs: Secondary | ICD-10-CM
